# Patient Record
Sex: Female | Born: 1975 | Hispanic: Yes | Marital: Married | Smoking: Never smoker
Health system: Southern US, Community
[De-identification: ages and names within clinical notes are randomized; demographics above are authoritative.]

## PROBLEM LIST (undated history)

## (undated) DIAGNOSIS — F32A Depression, unspecified: Secondary | ICD-10-CM

## (undated) DIAGNOSIS — E7800 Pure hypercholesterolemia, unspecified: Secondary | ICD-10-CM

## (undated) DIAGNOSIS — E559 Vitamin D deficiency, unspecified: Secondary | ICD-10-CM

## (undated) DIAGNOSIS — D573 Sickle-cell trait: Secondary | ICD-10-CM

## (undated) DIAGNOSIS — F329 Major depressive disorder, single episode, unspecified: Secondary | ICD-10-CM

## (undated) DIAGNOSIS — I8390 Asymptomatic varicose veins of unspecified lower extremity: Secondary | ICD-10-CM

## (undated) DIAGNOSIS — R569 Unspecified convulsions: Secondary | ICD-10-CM

## (undated) DIAGNOSIS — J302 Other seasonal allergic rhinitis: Secondary | ICD-10-CM

## (undated) DIAGNOSIS — Z969 Presence of functional implant, unspecified: Secondary | ICD-10-CM

## (undated) HISTORY — DX: Asymptomatic varicose veins of unspecified lower extremity: I83.90

## (undated) HISTORY — DX: Depression, unspecified: F32.A

## (undated) HISTORY — DX: Sickle-cell trait: D57.3

## (undated) HISTORY — DX: Unspecified convulsions: R56.9

## (undated) HISTORY — DX: Vitamin D deficiency, unspecified: E55.9

## (undated) HISTORY — PX: APPENDECTOMY: SHX54

## (undated) HISTORY — DX: Major depressive disorder, single episode, unspecified: F32.9

---

## 1997-10-28 ENCOUNTER — Emergency Department (HOSPITAL_COMMUNITY): Admission: EM | Admit: 1997-10-28 | Discharge: 1997-10-29 | Payer: Self-pay | Admitting: Emergency Medicine

## 1999-01-07 ENCOUNTER — Other Ambulatory Visit: Admission: RE | Admit: 1999-01-07 | Discharge: 1999-01-07 | Payer: Self-pay | Admitting: Gynecology

## 1999-07-18 ENCOUNTER — Inpatient Hospital Stay (HOSPITAL_COMMUNITY): Admission: AD | Admit: 1999-07-18 | Discharge: 1999-07-21 | Payer: Self-pay | Admitting: Gynecology

## 1999-08-25 ENCOUNTER — Other Ambulatory Visit: Admission: RE | Admit: 1999-08-25 | Discharge: 1999-08-25 | Payer: Self-pay | Admitting: Internal Medicine

## 2000-02-06 ENCOUNTER — Ambulatory Visit (HOSPITAL_COMMUNITY): Admission: RE | Admit: 2000-02-06 | Discharge: 2000-02-06 | Payer: Self-pay | Admitting: Gynecology

## 2000-02-06 HISTORY — PX: TUBAL LIGATION: SHX77

## 2000-09-23 ENCOUNTER — Ambulatory Visit (HOSPITAL_COMMUNITY): Admission: RE | Admit: 2000-09-23 | Discharge: 2000-09-23 | Payer: Self-pay | Admitting: Gynecology

## 2000-09-23 ENCOUNTER — Other Ambulatory Visit: Admission: RE | Admit: 2000-09-23 | Discharge: 2000-09-23 | Payer: Self-pay | Admitting: Internal Medicine

## 2001-09-13 ENCOUNTER — Other Ambulatory Visit: Admission: RE | Admit: 2001-09-13 | Discharge: 2001-09-13 | Payer: Self-pay | Admitting: Gynecology

## 2002-10-05 ENCOUNTER — Other Ambulatory Visit: Admission: RE | Admit: 2002-10-05 | Discharge: 2002-10-05 | Payer: Self-pay | Admitting: Gynecology

## 2003-10-12 ENCOUNTER — Other Ambulatory Visit: Admission: RE | Admit: 2003-10-12 | Discharge: 2003-10-12 | Payer: Self-pay | Admitting: Gynecology

## 2004-10-13 ENCOUNTER — Other Ambulatory Visit: Admission: RE | Admit: 2004-10-13 | Discharge: 2004-10-13 | Payer: Self-pay | Admitting: Gynecology

## 2005-10-14 ENCOUNTER — Other Ambulatory Visit: Admission: RE | Admit: 2005-10-14 | Discharge: 2005-10-14 | Payer: Self-pay | Admitting: Gynecology

## 2005-11-12 ENCOUNTER — Ambulatory Visit: Payer: Self-pay | Admitting: Internal Medicine

## 2005-12-27 ENCOUNTER — Ambulatory Visit (HOSPITAL_COMMUNITY): Admission: RE | Admit: 2005-12-27 | Discharge: 2005-12-27 | Payer: Self-pay | Admitting: Gynecology

## 2005-12-28 ENCOUNTER — Encounter (INDEPENDENT_AMBULATORY_CARE_PROVIDER_SITE_OTHER): Payer: Self-pay | Admitting: *Deleted

## 2005-12-28 ENCOUNTER — Inpatient Hospital Stay (HOSPITAL_COMMUNITY): Admission: RE | Admit: 2005-12-28 | Discharge: 2005-12-30 | Payer: Self-pay | Admitting: Gynecology

## 2005-12-28 HISTORY — PX: ABDOMINAL HYSTERECTOMY: SHX81

## 2006-06-18 ENCOUNTER — Ambulatory Visit: Payer: Self-pay | Admitting: Internal Medicine

## 2006-06-18 LAB — CONVERTED CEMR LAB
Calcium: 9.5 mg/dL (ref 8.4–10.5)
Chloride: 106 meq/L (ref 96–112)
Creatinine, Ser: 0.6 mg/dL (ref 0.4–1.2)
Glucose, Bld: 90 mg/dL (ref 70–99)
Potassium: 4 meq/L (ref 3.5–5.1)
Sed Rate: 13 mm/hr (ref 0–25)
Sodium: 139 meq/L (ref 135–145)

## 2006-09-22 ENCOUNTER — Encounter (INDEPENDENT_AMBULATORY_CARE_PROVIDER_SITE_OTHER): Payer: Self-pay | Admitting: Specialist

## 2006-09-22 ENCOUNTER — Ambulatory Visit (HOSPITAL_BASED_OUTPATIENT_CLINIC_OR_DEPARTMENT_OTHER): Admission: RE | Admit: 2006-09-22 | Discharge: 2006-09-22 | Payer: Self-pay | Admitting: Gynecology

## 2006-09-22 HISTORY — PX: LAPAROSCOPIC LYSIS OF ADHESIONS: SHX5905

## 2006-09-22 HISTORY — PX: OVARIAN CYST REMOVAL: SHX89

## 2006-11-15 ENCOUNTER — Ambulatory Visit: Payer: Self-pay | Admitting: Internal Medicine

## 2006-11-18 ENCOUNTER — Encounter: Payer: Self-pay | Admitting: Internal Medicine

## 2006-12-02 ENCOUNTER — Telehealth: Payer: Self-pay | Admitting: Internal Medicine

## 2006-12-16 ENCOUNTER — Encounter: Payer: Self-pay | Admitting: Internal Medicine

## 2007-02-02 ENCOUNTER — Ambulatory Visit: Payer: Self-pay | Admitting: Family Medicine

## 2007-02-03 ENCOUNTER — Encounter (INDEPENDENT_AMBULATORY_CARE_PROVIDER_SITE_OTHER): Payer: Self-pay | Admitting: Family Medicine

## 2007-03-29 ENCOUNTER — Ambulatory Visit: Payer: Self-pay | Admitting: Internal Medicine

## 2007-04-08 ENCOUNTER — Other Ambulatory Visit: Admission: RE | Admit: 2007-04-08 | Discharge: 2007-04-08 | Payer: Self-pay | Admitting: Gynecology

## 2007-04-13 ENCOUNTER — Encounter: Payer: Self-pay | Admitting: Internal Medicine

## 2007-04-13 LAB — CONVERTED CEMR LAB
Cholesterol: 252 mg/dL
Cholesterol: 252 mg/dL
Total CHOL/HDL Ratio: 6.7

## 2007-04-19 ENCOUNTER — Encounter: Payer: Self-pay | Admitting: Internal Medicine

## 2007-05-17 ENCOUNTER — Ambulatory Visit: Payer: Self-pay | Admitting: Internal Medicine

## 2007-05-17 DIAGNOSIS — E785 Hyperlipidemia, unspecified: Secondary | ICD-10-CM

## 2008-04-09 ENCOUNTER — Encounter: Payer: Self-pay | Admitting: Gynecology

## 2008-04-09 ENCOUNTER — Other Ambulatory Visit: Admission: RE | Admit: 2008-04-09 | Discharge: 2008-04-09 | Payer: Self-pay | Admitting: Gynecology

## 2008-04-09 ENCOUNTER — Ambulatory Visit: Payer: Self-pay | Admitting: Gynecology

## 2008-04-16 ENCOUNTER — Ambulatory Visit: Payer: Self-pay | Admitting: Gynecology

## 2008-07-11 ENCOUNTER — Ambulatory Visit: Payer: Self-pay | Admitting: Gynecology

## 2008-07-30 ENCOUNTER — Ambulatory Visit: Payer: Self-pay | Admitting: Gynecology

## 2008-10-25 ENCOUNTER — Ambulatory Visit: Payer: Self-pay | Admitting: Gynecology

## 2009-01-07 ENCOUNTER — Observation Stay (HOSPITAL_COMMUNITY): Admission: EM | Admit: 2009-01-07 | Discharge: 2009-01-07 | Payer: Self-pay | Admitting: Emergency Medicine

## 2009-01-31 ENCOUNTER — Ambulatory Visit: Payer: Self-pay | Admitting: Gynecology

## 2009-02-07 ENCOUNTER — Ambulatory Visit: Payer: Self-pay | Admitting: Gynecology

## 2009-04-04 ENCOUNTER — Ambulatory Visit: Payer: Self-pay | Admitting: Gynecology

## 2009-04-04 ENCOUNTER — Encounter: Payer: Self-pay | Admitting: Gynecology

## 2009-04-04 ENCOUNTER — Other Ambulatory Visit: Admission: RE | Admit: 2009-04-04 | Discharge: 2009-04-04 | Payer: Self-pay | Admitting: Gynecology

## 2009-09-21 ENCOUNTER — Emergency Department (HOSPITAL_COMMUNITY): Admission: EM | Admit: 2009-09-21 | Discharge: 2009-09-21 | Payer: Self-pay | Admitting: Emergency Medicine

## 2009-09-26 ENCOUNTER — Ambulatory Visit: Payer: Self-pay | Admitting: Gynecology

## 2009-10-04 DIAGNOSIS — R51 Headache: Secondary | ICD-10-CM

## 2009-10-04 DIAGNOSIS — G40909 Epilepsy, unspecified, not intractable, without status epilepticus: Secondary | ICD-10-CM | POA: Insufficient documentation

## 2009-10-04 DIAGNOSIS — F29 Unspecified psychosis not due to a substance or known physiological condition: Secondary | ICD-10-CM | POA: Insufficient documentation

## 2009-10-08 DIAGNOSIS — E559 Vitamin D deficiency, unspecified: Secondary | ICD-10-CM

## 2009-10-09 ENCOUNTER — Ambulatory Visit: Payer: Self-pay | Admitting: Cardiology

## 2009-12-05 ENCOUNTER — Ambulatory Visit: Payer: Self-pay | Admitting: Cardiology

## 2009-12-09 LAB — CONVERTED CEMR LAB
Alkaline Phosphatase: 43 units/L (ref 39–117)
Bilirubin, Direct: 0.1 mg/dL (ref 0.0–0.3)
Cholesterol: 92 mg/dL (ref 0–200)
LDL Cholesterol: 34 mg/dL (ref 0–99)
Total CHOL/HDL Ratio: 3
Triglycerides: 116 mg/dL (ref 0.0–149.0)
VLDL: 23.2 mg/dL (ref 0.0–40.0)

## 2010-01-02 ENCOUNTER — Ambulatory Visit: Payer: Self-pay | Admitting: Cardiology

## 2010-02-04 ENCOUNTER — Ambulatory Visit: Payer: Self-pay | Admitting: Cardiology

## 2010-02-11 ENCOUNTER — Telehealth: Payer: Self-pay | Admitting: Cardiology

## 2010-02-11 LAB — CONVERTED CEMR LAB
AST: 35 units/L (ref 0–37)
Alkaline Phosphatase: 45 units/L (ref 39–117)
Cholesterol: 194 mg/dL (ref 0–200)
HDL: 33.8 mg/dL — ABNORMAL LOW (ref >39.00)
Triglycerides: 218 mg/dL — ABNORMAL HIGH (ref 0.0–149.0)

## 2010-02-12 ENCOUNTER — Telehealth (INDEPENDENT_AMBULATORY_CARE_PROVIDER_SITE_OTHER): Payer: Self-pay | Admitting: *Deleted

## 2010-02-24 ENCOUNTER — Ambulatory Visit: Payer: Self-pay | Admitting: Cardiology

## 2010-02-26 LAB — CONVERTED CEMR LAB
ALT: 18 units/L (ref 0–35)
Bilirubin, Direct: 0.1 mg/dL (ref 0.0–0.3)
Cholesterol: 151 mg/dL (ref 0–200)
LDL Cholesterol: 86 mg/dL (ref 0–99)
Total CHOL/HDL Ratio: 4
Triglycerides: 137 mg/dL (ref 0.0–149.0)
VLDL: 27.4 mg/dL (ref 0.0–40.0)

## 2010-03-25 ENCOUNTER — Ambulatory Visit: Payer: Self-pay | Admitting: Cardiology

## 2010-03-25 DIAGNOSIS — E663 Overweight: Secondary | ICD-10-CM | POA: Insufficient documentation

## 2010-04-08 ENCOUNTER — Ambulatory Visit: Payer: Self-pay | Admitting: Gynecology

## 2010-04-08 ENCOUNTER — Other Ambulatory Visit: Admission: RE | Admit: 2010-04-08 | Discharge: 2010-04-08 | Payer: Self-pay | Admitting: Gynecology

## 2010-06-15 ENCOUNTER — Encounter: Payer: Self-pay | Admitting: *Deleted

## 2010-06-24 NOTE — Progress Notes (Signed)
  Faxed Labs over to [facility] in [place] @ [phone] [facility]  February 12, 2010 8:57 AM

## 2010-06-24 NOTE — Assessment & Plan Note (Signed)
Summary: 3 month rov.sl   Visit Type:  3 mo f/u Referring Provider:  Cherylin Mylar Primary Provider:  Valente David  CC:  no cardiac complaints today..pt lost 14 lb since 09/2009.  History of Present Illness: Ms Ancil Linsey returns today for management of her mixed hyperlipidemia and obesity.  She complains of muscle aches in her back in her knees. She is convinced as the Crestor at 40 mg a day. She did not have this at 10 mg a day when I first met her. However, looking at her numbers on initial dilation, I am not sure she was taking her Crestor daily. She states through an interpreter that she has been taking it daily for over a year.  She had a dramatic response to 40 mg a day with her total cholesterol being 92, triglycerides 116, HDL 35, LDL 34, VLDL 23. LFTs were normal.  She has lost 14 pounds.  Current Medications (verified): 1)  Vitamin D (Ergocalciferol) 50000 Unit Caps (Ergocalciferol) .Marland Kitchen.. 1 Tab Weekly 2)  Crestor 20 Mg Tabs (Rosuvastatin Calcium) .Marland Kitchen.. 1 Once Daily 3)  Omega-3 Fish Oil 1000 Mg Caps (Omega-3 Fatty Acids) .... 2 Caps Once Daily 4)  Caltrate 600+d Plus 600-400 Mg-Unit Tabs (Calcium Carbonate-Vit D-Min) .Marland Kitchen.. 1 Tab Two Times A Day 5)  Doxepin Hcl 10 Mg Caps (Doxepin Hcl) .Marland Kitchen.. 1 Tab Once Daily 6)  Gabapentin 600 Mg Tabs (Gabapentin) .Marland Kitchen.. 1 Tab Three Times A Day 7)  Levetiracetam 500 Mg Tabs (Levetiracetam) .Marland Kitchen.. 1 Tab Qam...2 Tab S Qhs  Allergies: 1)  ! Tylenol  Past History:  Past Medical History: Last updated: 10/04/2009 HYPERLIPIDEMIA-MIXED (ICD-272.4) VITAMIN D DEFICIENCY (ICD-268.9) CONFUSION (ICD-298.9) HEADACHE (ICD-784.0) SEIZURES, HX OF (ICD-V12.49)    Past Surgical History: Last updated: 10/09/2009 hysterectomy Ovary cyst removed c-section x 2 Appendectomy  Family History: Last updated: 10/09/2009 no family history of heart disease  Social History: Last updated: 10/09/2009 Full Time Married  Tobacco Use - No.  Alcohol Use - no Regular  Exercise - no Drug Use - no  Risk Factors: Exercise: no (10/09/2009)  Risk Factors: Smoking Status: never (10/09/2009)  She has no other complaints. Review of Systems       negative other than history of present illness  Vital Signs:  Patient profile:   35 year old female Height:      64 inches Weight:      144 pounds Pulse rate:   60 / minute Pulse rhythm:   regular BP sitting:   100 / 62  (left arm) Cuff size:   large  Vitals Entered By: Danielle Rankin, CMA (January 02, 2010 9:28 AM)  Physical Exam  General:  obese.   Head:  normocephalic and atraumatic Eyes:  PERRLA/EOM intact; conjunctiva and lids normal. Neck:  Neck supple, no JVD. No masses, thyromegaly or abnormal cervical nodes. Chest Octavia Velador:  no deformities or breast masses noted Lungs:  Clear bilaterally to auscultation and percussion. Heart:  PMI nondisplaced, regular rate and rhythm, normal S1-S2, carotid upstrokes equal without bruits Msk:  Back normal, normal gait. Muscle strength and tone normal. Pulses:  pulses normal in all 4 extremities Extremities:  No clubbing or cyanosis. Neurologic:  Alert and oriented x 3. Skin:  Intact without lesions or rashes. Psych:  Normal affect.   Impression & Recommendations:  Problem # 1:  OVERWEIGHT/OBESITY (ICD-278.02) Assessment Improved  Problem # 2:  HYPERLIPIDEMIA-MIXED (ICD-272.4) Assessment: Improved Her numbers dramatically improved with weight loss and 40 mg of Crestor. However, as noted above,  she is having muscle aches and pains in her back and knees. She would like to stop the medication and have repeat blood work. I have asked her to stop the Crestor and report if her aches get better. Will repeat blood work on no statin and 4 weeks. Contrary to her opinion, she will probably need a different statin most likely pravastatin. We'll reassess at that time. Patient has been encouraged to continue to lose weight and to exercise her weight. I will see her back in 3  months. The following medications were removed from the medication list:    Crestor 20 Mg Tabs (Rosuvastatin calcium) .Marland Kitchen... 1 once daily  Patient Instructions: 1)  Your physician recommends that you schedule a follow-up appointment in: 3 months with Dr. Daleen Squibb 2)  Your physician has recommended you make the following change in your medication:  stop crestor 3)  Your physician recommends that you return for a FASTING lipid profile and liver in 4 weeks.   272.4, 278.00

## 2010-06-24 NOTE — Assessment & Plan Note (Signed)
Summary: per check out/interper set up with Rashanda/saf   Visit Type:  rov Referring Provider:  Cherylin Mylar Primary Provider:  Valente David  CC:  pt states after she was told to increase her Pravastatin to 40 mg she started getting severe back pain so she cut back to 20 mg....sob at times.....  History of Present Illness: Beth Blackwell comes in today for followup of her mixed hyperlipidemia and obesity.  We increased her pravastatin 40 mg q.h.s. but she had significant backaches. She cut it back to 20 mg. Her numbers on 20 mg on October 3 toe cholesterol 151 triglycerides 137 ratio 37.3 LDL 86 LFTs were normal.  She is not walking on a regular basis. She is not working on weight reduction as well. She is asymptomatic.  Current Medications (verified): 1)  Vitamin D (Ergocalciferol) 50000 Unit Caps (Ergocalciferol) .Marland Kitchen.. 1 Tab Weekly 2)  Omega-3 Fish Oil 1000 Mg Caps (Omega-3 Fatty Acids) .... 2 Caps Once Daily 3)  Caltrate 600+d Plus 600-400 Mg-Unit Tabs (Calcium Carbonate-Vit D-Min) .Marland Kitchen.. 1 Tab Two Times A Day 4)  Doxepin Hcl 10 Mg Caps (Doxepin Hcl) .Marland Kitchen.. 1 Tab Once Daily 5)  Gabapentin 600 Mg Tabs (Gabapentin) .Marland Kitchen.. 1 Tab Three Times A Day 6)  Levetiracetam 500 Mg Tabs (Levetiracetam) .Marland Kitchen.. 1 Tab Qam...2 Tab S Qhs 7)  Pravastatin Sodium 20 Mg Tabs (Pravastatin Sodium) .Marland Kitchen.. 1 Tab At Bedtime  Allergies: 1)  ! Tylenol  Past History:  Past Medical History: Last updated: 10/04/2009 HYPERLIPIDEMIA-MIXED (ICD-272.4) VITAMIN D DEFICIENCY (ICD-268.9) CONFUSION (ICD-298.9) HEADACHE (ICD-784.0) SEIZURES, HX OF (ICD-V12.49)    Past Surgical History: Last updated: 10/09/2009 hysterectomy Ovary cyst removed c-section x 2 Appendectomy  Family History: Last updated: 10/09/2009 no family history of heart disease  Social History: Last updated: 10/09/2009 Full Time Married  Tobacco Use - No.  Alcohol Use - no Regular Exercise - no Drug Use - no  Risk Factors: Exercise: no  (10/09/2009)  Risk Factors: Smoking Status: never (10/09/2009)  Review of Systems       negative other than history of present illness  Vital Signs:  Patient profile:   35 year old female Height:      64 inches Weight:      145.4 pounds BMI:     25.05 Pulse rate:   72 / minute Pulse rhythm:   regular BP sitting:   80 / 60  (left arm) Cuff size:   large  Vitals Entered By: Danielle Rankin, CMA (March 25, 2010 9:04 AM)  Physical Exam  General:  obese.   Head:  normocephalic and atraumatic Eyes:  PERRLA/EOM intact; conjunctiva and lids normal. Neck:  Neck supple, no JVD. No masses, thyromegaly or abnormal cervical nodes. Lungs:  Clear bilaterally to auscultation and percussion. Heart:  PMI not displaced, normal S1-S2, no bruits Msk:  Back normal, normal gait. Muscle strength and tone normal. Pulses:  pulses normal in all 4 extremities Extremities:  No clubbing or cyanosis. Neurologic:  Alert and oriented x 3. Skin:  Intact without lesions or rashes. Psych:  Normal affect.   Problems:  Medical Problems Added: 1)  Dx of Overweight/obesity  (ICD-278.02)  Impression & Recommendations:  Problem # 1:  HYPERLIPIDEMIA-MIXED (ICD-272.4) Continue 20 mg q.h.s. Followup labs in a year. Her updated medication list for this problem includes:    Pravastatin Sodium 20 Mg Tabs (Pravastatin sodium) .Marland Kitchen... 1 tab at bedtime  Problem # 2:  OVERWEIGHT/OBESITY (ICD-278.02) I have encouraged her to lose weight and walk  3 hours per week.  Problem # 3:  HYPERLIPIDEMIA-MIXED (ICD-272.4)  Her updated medication list for this problem includes:    Pravastatin Sodium 20 Mg Tabs (Pravastatin sodium) .Marland Kitchen... 1 tab at bedtime  Patient Instructions: 1)  Your physician recommends that you schedule a follow-up appointment in: 1 YEAR 2)  Your physician recommends that you return for lab work [id number]

## 2010-06-24 NOTE — Progress Notes (Signed)
Summary: test results  Phone Note Call from Patient Call back at Home Phone [phone] Call back at [phone]   Caller: Patient Reason for Call: Lab or Test Results Initial call taken by: Judie Grieve,  February 11, 2010 4:11 PM  Follow-up for Phone Call        Called patient with lipid lab results. She felt better off the Crestor. She will start Pravastatin 20mg  every day in the evening and return for labs on 03/27/2010 per Dr.Wall.  Layne Benton, RN, BSN  February 11, 2010 4:46 PM     New/Updated Medications: PRAVASTATIN SODIUM 20 MG TABS (PRAVASTATIN SODIUM) 1 tab prior to bedtime Prescriptions: PRAVASTATIN SODIUM 20 MG TABS (PRAVASTATIN SODIUM) 1 tab prior to bedtime  #30 x 6   Entered by:   Layne Benton, RN, BSN   Authorized by:   Gaylord Shih, MD, [facility]   Signed by:   Layne Benton, RN, BSN on 02/11/2010   Method used:   [facility] to        [facility] [phone]* (retail)       [address]       [place], [place]  [address]       Ph: [phone] or [phone]       Fax: [phone]   RxID:   [id number]   Appended Document: test results  Reviewed Juanito Doom, MD

## 2010-06-24 NOTE — Assessment & Plan Note (Signed)
Summary: np6/hyperlipidemia/jml   Visit Type:  new pt visit Referring Provider:  Cherylin Mylar Primary Provider:  Valente David  CC:  pt had 2 episodes of what she called seizures and passed out and bit her tongue.....  History of Present Illness: Mrs. Ancil Linsey comes in today referred by Dr. Lily Peer for mixed hyperlipidemia.  She has been on Crestor 10 mg per day and 2 g official per day. Her last profile was total cholesterol 230, HDL 51, cholesterol HDL ratio 4.5, LDL of 113, and triglycerides of 325.  She is overweight and exercises some but not a lot. She likes pasta and fries.  She denies any symptoms of chest pain, angina, shortness of breath, orthopnea, edema.  She does not abuse alcohol.  Preventive Screening-Counseling & Management  Alcohol-Tobacco     Smoking Status: never  Caffeine-Diet-Exercise     Does Patient Exercise: no      Drug Use:  no.    Current Medications (verified): 1)  Vitamin D (Ergocalciferol) 50000 Unit Caps (Ergocalciferol) .Marland Kitchen.. 1 Tab Weekly 2)  Crestor 10 Mg Tabs (Rosuvastatin Calcium) .Marland Kitchen.. 1 Tab At Bedtime 3)  Omega-3 Fish Oil 1000 Mg Caps (Omega-3 Fatty Acids) .... 2 Caps Once Daily 4)  Caltrate 600+d Plus 600-400 Mg-Unit Tabs (Calcium Carbonate-Vit D-Min) .Marland Kitchen.. 1 Tab Two Times A Day 5)  Doxepin Hcl 10 Mg Caps (Doxepin Hcl) .Marland Kitchen.. 1 Tab Once Daily 6)  Gabapentin 600 Mg Tabs (Gabapentin) .Marland Kitchen.. 1 Tab Three Times A Day  Allergies (verified): 1)  ! Tylenol  Past History:  Past Medical History: Last updated: 10/04/2009 HYPERLIPIDEMIA-MIXED (ICD-272.4) VITAMIN D DEFICIENCY (ICD-268.9) CONFUSION (ICD-298.9) HEADACHE (ICD-784.0) SEIZURES, HX OF (ICD-V12.49)    Family History: Last updated: 10/09/2009 no family history of heart disease  Social History: Last updated: 10/09/2009 Full Time Married  Tobacco Use - No.  Alcohol Use - no Regular Exercise - no Drug Use - no  Risk Factors: Exercise: no (10/09/2009)  Risk Factors: Smoking  Status: never (10/09/2009)  Past Surgical History: hysterectomy Ovary cyst removed c-section x 2 Appendectomy  Family History: no family history of heart disease  Social History: Full Time Married  Tobacco Use - No.  Alcohol Use - no Regular Exercise - no Drug Use - no Smoking Status:  never Does Patient Exercise:  no Drug Use:  no  Review of Systems       negative other than history of present illness  Vital Signs:  Patient profile:   35 year old female Height:      64 inches Weight:      158 pounds BMI:     27.22 Pulse rate:   65 / minute Pulse rhythm:   regular BP sitting:   112 / 60  (left arm) Cuff size:   large  Vitals Entered By: Danielle Rankin, CMA (Oct 09, 2009 3:52 PM)  Physical Exam  General:  obese.   Head:  normocephalic and atraumatic Eyes:  PERRLA/EOM intact; conjunctiva and lids normal. Neck:  Neck supple, no JVD. No masses, thyromegaly or abnormal cervical nodes. Chest Daryel Kenneth:  no deformities or breast masses noted Lungs:  Clear bilaterally to auscultation and percussion. Heart:  Non-displaced PMI, chest non-tender; regular rate and rhythm, S1, S2 without murmurs, rubs or gallops. Carotid upstroke normal, no bruit. Normal abdominal aortic size, no bruits. Femorals normal pulses, no bruits. Pedals normal pulses. No edema, no varicosities. Abdomen:  Bowel sounds positive; abdomen soft and non-tender without masses, organomegaly, or hernias noted. No hepatosplenomegaly. Msk:  Back normal, normal gait. Muscle strength and tone normal. Pulses:  pulses normal in all 4 extremities Extremities:  No clubbing or cyanosis. Neurologic:  Alert and oriented x 3. Skin:  Intact without lesions or rashes. Psych:  Normal affect.   Problems:  Medical Problems Added: 1)  Dx of Overweight/obesity  (ICD-278.02)  EKG  Procedure date:  10/09/2009  Findings:      normal sinus rhythm, no acute changes.  Impression & Recommendations:  Problem # 1:   HYPERLIPIDEMIA-MIXED (ICD-272.4) Assessment Deteriorated  Her updated medication list for this problem includes:    Crestor 40 Mg Tabs (Rosuvastatin calcium) .Marland Kitchen... 1 once daily  Orders: EKG w/ Interpretation (93000)  Problem # 2:  OVERWEIGHT/OBESITY (ICD-278.02) Assessment: Unchanged  Patient Instructions: 1)  Your physician recommends that you schedule a follow-up appointment in: 3 MONTHS WITH DR Bethanne Mule 2)  Your physician recommends that you return for lab work in:8 WEEKS FASTING LIPID LIVER 272.4 V58.69 3)  Your physician has recommended you make the following change in your medication: INCREASE CREASTOR TO 40 MG EVERY DAY 4)  Your physician recommends a low cholesterol, low fat diet. Please see [facility] handout. 5)  Your physician discussed the importance of regular exercise and recommended that you start or continue a regular exercise program for good health. EXERCISE 3HOURS A WEEK 6)  Your physician encouraged you to lose weight for better health. 7)  LOW CARBOHYDRATE DIET  Prescriptions: CRESTOR 40 MG TABS (ROSUVASTATIN CALCIUM) 1 once daily  #30 x 11   Entered by:   Scherrie Bateman, LPN   Authorized by:   Gaylord Shih, MD, [facility]   Signed by:   Scherrie Bateman, LPN on 54/01/8118   Method used:   [facility] to        [facility] [phone]* (retail)       [address]       [place], [place]  [address]       Ph: [phone] or [phone]       Fax: [phone]   RxID:   [id number] CRESTOR 40 MG TABS (ROSUVASTATIN CALCIUM) 1 once daily  #30 x 11   Entered by:   Scherrie Bateman, LPN   Authorized by:   Gaylord Shih, MD, [facility]   Signed by:   Scherrie Bateman, LPN on 87/56/4332   Method used:   [facility]axed to ...       [facility] (retail)       [address]       [place], [place]  [address]       Ph: [phone]       Fax: [phone]   RxID:   [id number]

## 2010-06-25 ENCOUNTER — Ambulatory Visit (HOSPITAL_BASED_OUTPATIENT_CLINIC_OR_DEPARTMENT_OTHER)
Admission: RE | Admit: 2010-06-25 | Discharge: 2010-06-26 | Disposition: A | Discharge status: Home / Self Care | Payer: 59 | Attending: Orthopedic Surgery | Admitting: Orthopedic Surgery

## 2010-06-25 DIAGNOSIS — Z01812 Encounter for preprocedural laboratory examination: Secondary | ICD-10-CM | POA: Insufficient documentation

## 2010-06-25 DIAGNOSIS — E785 Hyperlipidemia, unspecified: Secondary | ICD-10-CM | POA: Insufficient documentation

## 2010-06-25 DIAGNOSIS — M24139 Other articular cartilage disorders, unspecified wrist: Secondary | ICD-10-CM | POA: Insufficient documentation

## 2010-06-25 HISTORY — PX: WRIST ARTHROSCOPY WITH ULNA SHORTENING: SHX5681

## 2010-06-25 LAB — POCT HEMOGLOBIN-HEMACUE: Hemoglobin: 11.8 g/dL — ABNORMAL LOW (ref 12.0–15.0)

## 2010-07-14 ENCOUNTER — Ambulatory Visit: Payer: 59 | Admitting: Gynecology

## 2010-07-14 NOTE — Op Note (Signed)
NAMESOMALY, MARTENEY NO.:  [id number]  MEDICAL RECORD NO.:  [id number]          PATIENT TYPE:  AMB  LOCATION:  [facility]                          FACILITY:  [facility]  PHYSICIAN:  Cindee Salt, M.D.       DATE OF BIRTH:  1975/10/29  DATE OF PROCEDURE:  06/25/2010 DATE OF DISCHARGE:                              OPERATIVE REPORT   PREOPERATIVE DIAGNOSIS:  Ulnocarpal abutment, pisotriquetral arthrosis, right wrist.  POSTOPERATIVE DIAGNOSES: 1. Ulnocarpal abutment, pisotriquetral arthrosis, right wrist. 2. Scapholunate ligament tear, triangular fibrocartilage complex tear.  OPERATION:  Arthroscopy of right wrist with debridement of triangular fibrocartilage complex, shrinkage of scapholunate ligament, opened excision pisiform with ulnar shortening osteotomy, right wrist.  SURGEON:  Cindee Salt, MD  ANESTHESIA:  Axillary general.  ANESTHESIOLOGIST:  Zenon Mayo, MD  HISTORY:  The patient is a 35 year old female with a history of injury to her right wrist with wrist pain, a TFCC tear was noted on MRI, this does not responded to conservative treatment.  She ultimately suffered a fracture of her fifth metacarpal, this has been treated, this gone on to heal.  She is admitted now for arthroscopy of her wrist.  She also has pain in the pisotriquetral area with a positive pisotriquetral grind, this also has not responded to conservative treatment.  She is well aware of risks and complications including infection, recurrence injury to arteries, nerves, tendons, incomplete relief of symptoms and dystrophy.  In the preoperative area, the patient was seen, the extremity marked by both the patient and surgeon, and antibiotic given.  PROCEDURE:  The patient was brought to the operating room where a supraclavicular block was carried out without difficulty.  General anesthetic was also given.  Time-out taken confirming the patient and procedure.  TEDs stocking was placed  for control DVT.  She was prepped using ChloraPrep, supine position, right arm free.  A 3-minute dry time was allowed.  The limb was placed in the arthroscopy tower, 10 pounds of traction applied.  The joint inflated to 3/4 portal.  Transverse incision was made, deepened with hemostat.  Blunt trocar was used to enter the joint.  Joint was inspected a partial tear proximally, the scapholunate ligament with a significant widening, hang down of the scapholunate ligament with significant redundancy was noted.  Volar radial wrist ligaments were intact.  Cartilage was intact.  TFCC showed a delaminating tear in the central aspect.  The lunotriquetral joint appeared to be intact, 4/5 portal was opened, the irrigation catheter placed in 6U.  The lunotriquetral joint was inspected, found to be intact.  The midcarpal joint was then inspected through the ulnar midcarpal portal after inflation.  Transverse incision was made, deepened with a hemostat.  Blunt trocar was used to enter the joint.  No significant widening of the scapholunate and lunotriquetral ligaments were noted.  Cartilage was intact.  Type 2 lunate was present.  The scope was reintroduced proximally and debridement of the TFCC was then performed with sharp dissection followed by an Merton Border wand full radius shaver.  The scope placed in the 4/5 portal.  The scapholunate  was then shrunk firmly tightening this.  A further debridement, synovectomy was then performed.  The fovea was probed with the probe, appeared to be intact.  The instruments were removed.  The portals were closed with interrupted 5-0 Vicryl Rapide sutures.  The limb was exsanguinated with an Esmarch bandage.  Tourniquet was placed high and the arm was inflated to 250 mmHg.  A volar Brunner type incision was made over the pisiform, carried down through the subcutaneous tissue.  Bleeders were again electrocauterized with bipolar.  The flexor carpi ulnaris was identified and  incision was made longitudinally.  The pisiform was then removed in toto using blunt sharp dissection with great care taken to protect the awl ulnar artery and nerve.  The area was irrigated.  The tendon was then repaired with figure-of-eight 4-0 Mersilene sutures.  The skin with interrupted 5-0 Vicryl Rapide after closure of the subcutaneous tissue with interrupted 4-0 Vicryl.  A separate incision was then made over the ulnar aspect of the forearm, carried down through the subcutaneous tissue.  Bleeders were again electrocauterized with bipolar.  The dorsal sensory branch of the ulnar nerve was not seen, it was looked forward. An incision was then made before the fifth, sixth dorsal compartment carried down to the ulna.  Bleeders were again electrocauterized with bipolar and tied with 4-0 Vicryl.  Periosteum was incised.  The seven- hole 2.4 Modular plate was then selected, this was bent to the contour of the ulna.  Retractor was placed.  The distal three screws were then placed, nonlocking, each measured 10 mm.  An osteotomy was then made, obliquely in nature with an oscillating saw removing approximately 3 mm of bone.  The plate was reapplied distally, clamped proximally.  This was then compressed and an oblique screw placed across this, measured 14 mm.  This firmly compressed the osteotomy site.  The remaining three proximal screws were then placed, each measured 12 mm.  X-rays confirmed adequate shortening of the ulna.  The osteotomy site was not clearly visible with the 14-mm screw crossing, which appeared long.  This was replaced with a 12-mm screw.  Repeat x-rays reveal the screws lied in good position.  The osteotomy site was fully closed.  The wound was copiously irrigated with saline.  The periosteum was closed with a running 4-0 Vicryl suture.  The fascia was closed with a running 4-0 Vicryl, the subcutaneous tissue with interrupted 4-0 Vicryl, and the skin with interrupted 4-0  Vicryl Rapide sutures.  A sterile compressive dressing, long-arm thumb spica splint was applied.  On deflation of the tourniquet, all fingers were immediately pinked.  She was taken to the recovery room for observation in satisfactory condition.  She will be admitted for overnight stay for pain control, discharged on Percocet, to return in 1 week.          ______________________________ Cindee Salt, M.D.     GK/MEDQ  D:  06/25/2010  T:  06/26/2010  Job:  [id number]  Electronically Signed by Cindee Salt M.D. on 07/14/2010 02:24:46 PM

## 2010-07-15 ENCOUNTER — Other Ambulatory Visit: Payer: Self-pay | Admitting: Gynecology

## 2010-07-15 ENCOUNTER — Ambulatory Visit (INDEPENDENT_AMBULATORY_CARE_PROVIDER_SITE_OTHER): Payer: 59 | Admitting: Gynecology

## 2010-07-15 DIAGNOSIS — N898 Other specified noninflammatory disorders of vagina: Secondary | ICD-10-CM

## 2010-07-15 DIAGNOSIS — R350 Frequency of micturition: Secondary | ICD-10-CM

## 2010-07-15 DIAGNOSIS — B373 Candidiasis of vulva and vagina: Secondary | ICD-10-CM

## 2010-07-15 DIAGNOSIS — N63 Unspecified lump in unspecified breast: Secondary | ICD-10-CM

## 2010-07-21 ENCOUNTER — Ambulatory Visit
Admission: RE | Admit: 2010-07-21 | Discharge: 2010-07-21 | Disposition: A | Discharge status: Home / Self Care | Payer: 59 | Source: Ambulatory Visit | Attending: Gynecology | Admitting: Gynecology

## 2010-07-21 ENCOUNTER — Other Ambulatory Visit: Payer: Self-pay | Admitting: Gynecology

## 2010-08-12 LAB — RAPID URINE DRUG SCREEN, HOSP PERFORMED
Amphetamines: NOT DETECTED
Benzodiazepines: NOT DETECTED
Opiates: NOT DETECTED
Tetrahydrocannabinol: NOT DETECTED

## 2010-08-12 LAB — URINALYSIS, ROUTINE W REFLEX MICROSCOPIC
Specific Gravity, Urine: 1.014 (ref 1.005–1.030)
Urobilinogen, UA: 0.2 mg/dL (ref 0.0–1.0)

## 2010-08-12 LAB — BASIC METABOLIC PANEL
CO2: 24 mEq/L (ref 19–32)
Chloride: 109 mEq/L (ref 96–112)
Creatinine, Ser: 0.67 mg/dL (ref 0.4–1.2)
GFR calc Af Amer: >60 mL/min (ref >60)
Glucose, Bld: 107 mg/dL — ABNORMAL HIGH (ref 70–99)
Potassium: 3.6 mEq/L (ref 3.5–5.1)

## 2010-08-12 LAB — DIFFERENTIAL
Eosinophils Absolute: 0 10*3/uL (ref 0.0–0.7)
Eosinophils Relative: 1 % (ref 0–5)
Lymphocytes Relative: 32 % (ref 12–46)
Lymphs Abs: 2.3 10*3/uL (ref 0.7–4.0)
Monocytes Absolute: 0.2 10*3/uL (ref 0.1–1.0)
Neutrophils Relative %: 64 % (ref 43–77)

## 2010-08-12 LAB — URINE MICROSCOPIC-ADD ON

## 2010-08-12 LAB — POCT PREGNANCY, URINE: Preg Test, Ur: NEGATIVE

## 2010-08-12 LAB — CBC
RBC: 3.91 MIL/uL (ref 3.87–5.11)
RDW: 12.9 % (ref 11.5–15.5)

## 2010-08-31 LAB — POCT I-STAT, CHEM 8
BUN: 16 mg/dL (ref 6–23)
Calcium, Ion: 1.22 mmol/L (ref 1.12–1.32)
Chloride: 105 meq/L (ref 96–112)
Creatinine, Ser: 0.7 mg/dL (ref 0.4–1.2)
Glucose, Bld: 102 mg/dL — ABNORMAL HIGH (ref 70–99)
HCT: 39 % (ref 36.0–46.0)
Hemoglobin: 13.3 g/dL (ref 12.0–15.0)
Potassium: 3.5 meq/L (ref 3.5–5.1)
Sodium: 140 mEq/L (ref 135–145)
TCO2: 26 mmol/L (ref 0–100)

## 2010-10-10 NOTE — Discharge Summary (Signed)
[facility]  Patient:    Beth Blackwell, Beth Blackwell                          MRN: [id number] Adm. Date:  [id number] Disc. Date: [id number] Attending:  Tonye Royalty Dictator:   Antony Contras, R.N.C., [facility]                           Discharge Summary  DISCHARGE DIAGNOSES:          1. Intrauterine pregnancy at 39.5 weeks with breech                                  presentation.                               2. Low cervical transverse cesarean section, with                                  delivery of a viable female infant.  HISTORY OF PRESENT ILLNESS:   Patient is a 35 year old gravida 2 para 1 with an EDC of July 20, 1999.  Pregnancy has been complicated by a history of pyelonephritis in the previous pregnancy, for which she was hospitalized and required IV antibiotic therapy.  During this pregnancy, she was placed on Macrobid prophylaxis and did well.  She is also sickle cell trait positive, confirmed by  hemoglobin electrophoresis.  Her latest hemoglobin electrophoresis was normal prior to admission.  LABORATORY DATA:              Blood type is O positive, Rh antibody negative, positive sickle cell, rubella positive, negative HBsAG, negative HIV, MSAFP within normal limits, negative group B strep.  HOSPITAL COURSE:              Patient was admitted for a cesarean section due to breech presentation.  She was not interested in attempting version.  She did have a previous cesarean section with her last pregnancy.  Low cervical transverse cesarean section was performed by Dr. Lily Peer under epidural anesthesia. Patient was delivered of an Apgar 8 and 37 female infant weighing 8 pounds 6 ounces. Estimated blood loss was 600 cc.  Postoperative course was benign.  She remained afebrile, had no difficulty voiding.  Postoperative laboratory data revealed hematocrit 33.4, hemoglobin 11.9, platelets 161, WBC 7.9.  She was  discharged on postoperative day #3 in satisfactory condition.  DISPOSITION AND FOLLOW-UP:    With [facility] in four to six weeks for her postpartum exam.  MEDICATIONS:                  1. Prenatal vitamins.                               2. Tylox 20 1-2 p.o. q.4-6h. p.r.n. pain. DD:  08/08/99 TD:  08/08/99 Job: [id number]

## 2010-10-10 NOTE — Op Note (Signed)
[facility]  Patient:    Beth Blackwell, Beth Blackwell                          MRN: [id number] Proc. Date: 02/06/00 Attending:  Gaetano Hawthorne. Lily Peer, M.D.                           Operative Report  PREOPERATIVE DIAGNOSIS:       Request for elective permanent sterilization.  POSTOPERATIVE DIAGNOSIS:      Request for elective permanent sterilization.  OPERATION:                    Laparoscopic tubal sterilization procedure,                               bilateral Hulka clip technique.  SURGEON:                      Juan H. Lily Peer, M.D.  ANESTHESIA:                   General endotracheal.  INDICATIONS:                  A 35 year old gravida 2, para 2, with request for elective permanent sterilization.  FINDINGS:                     Normal pelvic anatomy and normal smooth-appearing liver surface and gallbladder, and absence of appendix from previous appendectomy.  DESCRIPTION OF PROCEDURE:     After the patient was adequately counseled, she was taken to the operating room.  She underwent a successful general endotracheal anesthesia.  She was placed in the low lithotomy position.  The abdomen, vagina, and perineum were prepped and draped in the usual sterile fashion.  The bladder was evacuated of its contents with a red rubber Roxan Hockey, approximately 50 cc.  The patient did receive 1 g of Cefotan for preoperative prophylaxis.                                After the drapes were in placed, a small stab incision was made in the region of the subumbilical region.  The incision was carried down through the skin and subcutaneous tissue to the rectus fascia, at which point, the Veress needle was introduced into the abdominal cavity.  The opening intra-abdominal pressure was 2.5 mm of pressure, and approximately 3.5 L of carbon dioxide were insufflated into the abdominal cavity.                                After this, the Veress was removed.  A 10 mm trocar was  inserted and the sleeve was left in place and the trocar removed. Under laparoscopic guidance, a second puncture site was made 2 cm above the symphysis pubis in the midline under laparoscopic visualization.  After systemic inspection of the upper abdomen as well as the pelvic cavity with the above mentioned normal findings, the right fallopian tube was placed under tension with self-retaining grasper laparoscopically.  A Hulka clip was placed in the proximal one-third portion of the fallopian tube, ascertaining complete occlusion of that tube.  A similar procedure was carried out on  the contralateral side.  Pictures were obtained.  A copy to be kept in the patients record in the hospital, as well as a second set in the records at [facility].                                After the pictures were obtained, the carbon dioxide was removed from the abdominal cavity.  The instruments were removed. The 10 mm trocar site fascia was reapproximated with a figure-of-eight suture of 0 Vicryl suture, and the skin was reapproximated with subcuticular stitch of 4-0 plain catgut suture.  At the 5 mm trocar site only the skin was reapproximated with interrupted sutures of 4-0 plain catgut suture.                                For postoperative analgesia, the patient received 10 cc of 0.25% Marcaine which was infiltrated subcutaneously at both incision sites.  The incisions were covered with band-aids.  The Hulka tenaculum was removed.  Inspection of the cervix demonstrated a small area that was bleeding which was contained with silver nitrate stick application and pressure.                                The patient was awakened and transferred to the recovery room with stable vital signs.  Blood loss was minimal.  Fluid resuscitation consisted of 1200 cc of lactated Ringers. DD:  02/06/00 TD:  02/09/00 Job: [id number]

## 2010-10-10 NOTE — Op Note (Signed)
Beth Blackwell, BIEL NO.:  [id number]   MEDICAL RECORD NO.:  [id number]          PATIENT TYPE:  INP   LOCATION:  9302                          FACILITY:  [facility]   PHYSICIAN:  Juan H. Lily Peer, M.D.DATE OF BIRTH:  [birth year]   DATE OF PROCEDURE:  12/28/2005  DATE OF DISCHARGE:                                 OPERATIVE REPORT   INDICATIONS FOR OPERATION:  A 35 year old gravida 2, para 2 with  dysmenorrhea and menorrhagia.  Patient with history of septate uterus.  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   PREOPERATIVE DIAGNOSES:  1. Menometrorrhagia.  2. Septated uterus.  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 Menometrorrhagia.  2. Septated uterus.  3. 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   SURGEON:  Juan A.  Lily Peer, MD   FIRST ASSISTANT:  Daniel __________, MD   FINDINGS:  Slight indentation in the fundus of the uterus, otherwise normal  ovaries, evidence of previous tubal sterilization.  Hulka clips were evident  in the proximal one third portion of both fallopian tubes.  The rest of the  pelvic cavity was otherwise unremarkable.   DESCRIPTION OF OPERATION:  After the patient was adequately counseled she  was taken to the operating room where she underwent successful general  endotracheal anesthesia.  She had received 2 grams of cefoxitin  prophylactically and also had PSA stockings for DVT prophylaxis.   After successful general endotracheal anesthesia the abdomen, vagina, and  perineum were prepped and draped in the usual sterile fashion.  A Foley  catheter had been inserted and obtained a moderate urinary output.  After  the abdomen was prepped and draped in the usual sterile fashion, a  Pfannenstiel skin incision was made  adjacent to the previous Pfannenstiel  scar.  The incision was carried down through the skin, subcutaneous tissue  down to the rectus fascia whereby a midline nick was made.  The fascia was  incised in a transverse fashion.  The midline raphe was entered.  The  peritoneal cavity was entered cautiously.  The patient was placed in slight  Trendelenburg position after O'Connor-O'Sullivan retractors were in place.  Findings as described above the proximal one.   The proximal portion of the uterus was grasped with Kelly clamps on both  sides for traction.  The right round ligament was identified and transected;  and the anterior broad ligament was incised to the level of the internal  cervical os.  The posterior broad ligament was penetrated with a surgeon's  finger; and the Heaney clamp grasped hugging the uterus.  The Ellik clamp  was placed close distance to the previous clamp; and the right ovarian utero-  ovarian ligament and fallopian tube were transected.  This was suture  ligated with #0 Vicryl suture followed by transfixion stitch of a similar  suture material.  Of note, both Hulka  clips had been removed; and then  passed off the operative field.   A similar procedure was carried out on the contralateral side.  The  remaining broad and cardinal ligaments were serially clamped, cut and suture  ligated with #0 Vicryl suture to the level of the internal cervical os.  The  anterior cervix was incised with a scalpel and entered and with the use of  Jergeson scissors the cervix was removed from the vagina and the cervix and  uterus were passed off the operative field.   The vaginal cuff the angles were secured with figure-of-eight; and the  remaining cuff was closed with interrupted figure-of-eight sutures of #0  Vicryl suture.  The pelvic cavity was copiously irrigated with normal saline  solution for additional hemostasis.  Surgicel was placed in the cuff and  __________ bladder  surface.  Sponge count and needle count were correct.  The O'Connor-O'Sullivan retractor was removed.  The distal peritoneum was  now reapproximated; and the rectus fascia was closed with a running stitch  of #0 Vicryl suture.  Subcutaneous bleeders were Bovie cauterized.  The skin  was reapproximated with skin clips, followed by placement of Xeroform gauze  and 4 by dressing.  The patient was extubated, and transferred to recovery  room with stable vital signs.  Blood loss was 300 mL, IV fluid 1900 mL, and  lactated Ringer's; and urine output was 400 mL.      Juan H. Lily Peer, M.D.  Electronically Signed     JHF/MEDQ  D:  12/28/2005  T:  12/28/2005  Job:  [id number]

## 2010-10-10 NOTE — H&P (Signed)
Beth Blackwell, DIFFEE NO.:  [id number]   MEDICAL RECORD NO.:  [id number]          PATIENT TYPE:  INP   LOCATION:                                FACILITY:  [facility]   PHYSICIAN:  Juan H. Lily Peer, M.D.DATE OF BIRTH:  12/28/2005   DATE OF ADMISSION:  01/01/2006  DATE OF DISCHARGE:  12/30/2005                                HISTORY & PHYSICAL   CHIEF COMPLAINT:  1. Menometrorrhagia.  2. Septated uterus.   HISTORY OF PRESENT ILLNESS:  The patient is a 35 year old gravida 2, para 2  who has been complaining for several months of worsening dysmenorrhea.  The  patient had a sonohistogram and endometrial biopsy for assessment of  endometrial cavity as a result of her dysfunctional uterine bleeding.  She  had also complained of increased weight.  At the time of her annual exam a  few weeks prior, her hemoglobin A1C which was done was normal.  Her Pap  smear was normal.  TSH was normal.  Her cholesterol was slightly elevated at  221.  She was scheduled to return back for a fasting lipid profile.  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  On  sonohistogram, a septum was noted up to the __________ of the lower uterine  segment.  Review of the patient's records indicated that her first cesarean  section in [place] did not describe any evidence of any septum, and the  cesarean section which was done here in the [place] in 2001 just  demonstrated a small arcuate at the fundus of the uterus.  The patient also  had a laparoscopic tubal sterilization procedure in 2001, and there was no  serosal defect or any evidence of separation of both uteruses or externally.  The patient initially was going to be scheduled for endometrial ablation,  but with this abnormal finding, it was decided that it would be safer to  proceed with a hysterectomy.  Due to the fact that she has had multiple  abdominal operations, two cesarean sections and one laparoscopic tubal  ligation, the abdominal approach will be utilized.   ALLERGIES:  THE PATIENT DENIES ANY ALLERGIES.   PAST MEDICAL HISTORY:  1. Two previous cesarean sections, one in [place].  Has a midline scar and      a transverse incision in the [place].  2. Laparoscopic tubal ligation.  3. Appendectomy in the past.   MEDICATIONS:  She takes no medications at the present time.   FAMILY HISTORY:  Diabetes in both parents and hypertension in both parents  as well.   PHYSICAL EXAMINATION:  VITAL SIGNS:  Weight 152 pounds, 5 feet 2.5 inches  tall.  HEENT:  Unremarkable.  NECK:  Supple.  Trachea midline.  No carotid bruits or thyromegaly.  LUNGS:  Clear to auscultation.  Without rhonchi or wheezes.  HEART:  Regular rate and rhythm.  No murmurs or gallops.  BREASTS:  At the time of her annual exam in May of this year, examination  was normal.  ABDOMEN:  Soft, nontender.  Without rebound or guarding.  Midline incision  scar noted as well as a Pfannenstiel scar and subumbilical scar as well.  PELVIC:  Bartholin, urethral, and Skene's glands within normal limits.  Vagina and cervix with no lesions or discharge.  Uterus upper limits of  normal.  Adnexa with no palpable masses or tenderness.  RECTAL:  Deferred.   ASSESSMENT:  35 year old gravida 2, para 2 with menometrorrhagia.  Workup  has consisted of endometrial biopsy, although only one cavity was entered  due to the fact that a septated uterus did not allow for separation of both  sampling, but the only sampling that was obtained demonstrated a benign  endometrium.  Prior Pap smear, TSH, and prolactin were all normal.  The  patient is scheduled to undergo abdominal hysterectomy.  The risks,  benefits, and alternatives of the procedure were discussed.  All questions  were answered, and we will follow accordingly.   PLAN:  The patient is scheduled for total abdominal  hysterectomy on Monday,  December 28, 2005 at 7:30 a.m.  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      Juan H. Lily Peer, M.D.  Electronically Signed     JHF/MEDQ  D:  12/27/2005  T:  12/27/2005  Job:  [id number]

## 2010-10-10 NOTE — Op Note (Signed)
NAMEBRAYLON, LEMMONS NO.:  [id number]   MEDICAL RECORD NO.:  [id number]          PATIENT TYPE:  AMB   LOCATION:  [facility]                         FACILITY:  [facility]   PHYSICIAN:  Juan H. Lily Peer, M.D.DATE OF BIRTH:  May 14, 1976   DATE OF PROCEDURE:  09/22/2006  DATE OF DISCHARGE:                               OPERATIVE REPORT   SURGEON:  Juan H. Lily Peer, M.D.   FIRST ASSISTANT:  Timothy P. Fontaine, M.D.   INDICATIONS FOR OPERATION:  35 year old gravida 2, para 2, with right  ovarian cyst measuring 8 by 5.8 by 6.5 cm and also persistent vaginal  bleeding on and off since her hysterectomy, vaginal cuff granulation  tissue.   PREOPERATIVE DIAGNOSIS:  1. Right ovarian cyst.  2. Vaginal cuff lesion.   POSTOPERATIVE DIAGNOSIS:  1. Right pelvic ovarian cyst.  2. Vaginal cuff lesion/granulation tissue.  3. Extensive abdominal pelvic adhesions.   PROCEDURES PERFORMED:  1. Laparoscopic abdominal pelvic adhesiolysis, extensive.  2. Right ovarian cystectomy.   DESCRIPTION OF OPERATION:  After the patient was adequately counseled,  she was taken to the operating room where she underwent a successful  general endotracheal anesthesia.  She was placed in the low lithotomy  position.  The abdomen, vagina and perineum were prepped and draped in  the usual sterile fashion.  A Foley catheter had been inserted to  monitor urinary output.  The patient received a gram of cefoxitin and  had PSA stockings for DVT prophylaxis.  A small stab incision was made  under the umbilicus followed by introducing the OptiVu trocar under  direct visualization getting it safely into the peritoneal cavity.  Once  the pneumoperitoneum was established, two 5 mm ports were made four  fingerbreadths from the midline under laparoscopic guidance.  The  patient had extensive omentum adhered to the anterior abdominal wall and  pelvic sidewalls, as well, which required meticulous dissection with  counter traction and utilization of the tripolar cauterization unit to  be able to gain access to the pelvic cavity.   Once this was accomplished, the left ovary was normal configuration. The  right ovary appeared to be enlarged with no excrescences seen on the  surface. The right infundibulopelvic ligament was identified.  The ovary  was adherent to the sigmoid colon. A linear incision was made  contralateral to the sigmoid colon where the ovarian capsule was and  with the endoshears, the cyst was separated from the cortex.  It  collapsed and the area was irrigated and the ovarian cyst walls were  meticulously piecemeal away from the cortex. There was good hemostasis.  Interceed was placed for additional hemostasis after the pelvic cavity  had been copiously irrigated with normal saline solution and after  pelvic washings had been obtained.   After ascertaining hemostasis, the pneumoperitoneum was removed.  The 10  mm trocar port site at the umbilicus was closed with a single  interrupted figure-of-eight of 0 Vicryl suture and the subcutaneous  tissue was reapproximated with 3-0 plain catgut suture and the skin  edges of the 10 mm trocar port  and 5 mm skin were reapproximated with  Dermabond.  0.25% Marcaine was infiltrated in all three port sites for  postoperative analgesia.   Attention was then placed to the vaginal aspect of the operation with  the patient placed in the high lithotomy position and vaginal sidewall  retractors for exposure, with the use of Allis clamp bringing the  vaginal cuff into view.  Beneath one of the vaginal folds, there was  granulation tissue which was friable which was excised and using the  [facility] surgical generator with a ball electrode set at 30  watts, the area was then cauterized after the lesions were removed.  This was submitted for pathological evaluation.  The patient was  extubated and transferred to the recovery room with stable  vital signs.  Blood loss was minimal. Fluid resuscitation consisted 1600 mL of  lactated Ringer's.  Urine output 150 mL and clear.      Juan H. Lily Peer, M.D.  Electronically Signed     JHF/MEDQ  D:  09/22/2006  T:  09/22/2006  Job:  [id number]

## 2010-10-10 NOTE — H&P (Signed)
[facility]  Patient:    Beth Blackwell, Beth Blackwell                          MRN: [id number] Adm. Date:  02/06/00 Attending:  Gaetano Hawthorne. Lily Peer, M.D.                         History and Physical  CHIEF COMPLAINT:              Request for elective permanent sterilization.  HISTORY:                      The patient is a 35 year old gravida 2, para 2, who was seen in the office on June 25 for a preoperative consultation.  She was interested in proceeding with a laparosopic tubal sterilization procedure. She has had two cesarean sections in the past.  She suffered from postpartum anemia, resulted in returning back to normal values after being placed on iron supplementation.  She at one time had been interested in a _________ T-380 IUD, but decided between her and her husband to have permanent sterilization. She has been on Micronor 20 _________ contraceptive pills otherwise.  She had been provided with literature information in the office for laparoscopic tubal sterilization procedure.  PAST MEDICAL HISTORY:         The patient had menarche at age 55, regular cycles reported.  She had been on Micronor oral contraceptive pills.  Cesarean sections in 1996 and 2001 respectively.  The first one was in [place], the second one was in [place].  She had an appendectomy at the age of 26 in [place].  No other hospitalizations reported.  ALLERGIES:                    She denies any allergies.  HABITS:                       No alcohol or cigarette abuse reported.  FAMILY HISTORY:               She has one brother and sister, no family history of any abnormal medical problems reported.  MEDICATIONS:                  She is only taking vitamins as well as birth control pills.  PHYSICAL EXAMINATION:  GENERAL:                      Well-developed, well-nourished female.  HEENT:                        Unremarkable.  NECK:                         Trachea midline, no carotid  bruits, no thyromegaly.  LUNGS:                        Clear to auscultation without rhonchi or wheezes.  HEART:                        Regular rate and rhythm, no murmurs or gallops.  BREASTS:                      Breast examination was done at time  of her postpartum visit on April of this year which was reported to be normal.  ABDOMEN:                      Soft, nontender without rebound or guarding.  PELVIC:                       _________ within normal limits.  Vagina and cervix, no lesions or discharge.  Uterus anteverted, normal size, shape, and consistency.  Adnexa without masses or tenderness.  RECTAL:                       Deferred.  ASSESSMENT:                   A 35 year old gravida 2, para 2, with request for elective permanent sterilization.  She was provided with the literature information from the [employer] and laparoscopic tubal sterilization procedure.  Different techniques were discussed.  Risk factors and failure rates were discussed and all questions were answered.  The patient would like to proceed with such a procedure.  We discussed the potential of failure rates of one in 600 was recorded rate.  We discussed also the potential for intra-abdominal trauma requiring corrective surgery and open laparotomy for correction.  She is also aware that this procedure is permanent.  She will not be able to have any more children.  Also in the event of a blood transfusion that there is a potential risk of antiphylactic reaction, hepatitis, and AIDS.  All questions were answered in English and in Spanish and will follow accordingly.  PLAN:                         Patient is scheduled for laparoscopic tubal sterilization procedure tomorrow, Friday, September 14, at [facility]. DD:  02/05/00 TD:  02/05/00 Job: [id number]

## 2010-10-10 NOTE — H&P (Signed)
Beth Blackwell, STANDLEE NO.:  [id number]   MEDICAL RECORD NO.:  [id number]        PATIENT TYPE:  HAMB   LOCATION:                               FACILITY:  [facility]   PHYSICIAN:  Juan H. Lily Peer, M.D.DATE OF BIRTH:  June 08, 1975   DATE OF ADMISSION:  09/22/2006  DATE OF DISCHARGE:                              HISTORY & PHYSICAL   The patient is scheduled for surgery tomorrow at [facility] at 1 p.m.   CHIEF COMPLAINT:  1. Left ovarian cyst.  2. Persistent vaginal cuff granulation tissue.   HISTORY:  The patient is a 35 year old gravida 2 para 2 who on December 29, 2006 had undergone a total abdominal hysterectomy with ovarian  conservation secondary to menometrorrhagia and septated uterus.  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  The patient did well, but postoperatively she presented to the  office complaining of pinkish bloody discharge.  It appeared she had  some granulation tissue that was present at the vaginal cuff, which had  been biopsied, and pathology report demonstrated polypoid fragment of  ulcerated granulation tissue.  There was no evidence of atypia or  malignancy, and her pathology report from her surgery did confirm the  septated uterus, but no malignancy identified.  During the most recent  evaluation in the office on September 16, 2006 because she has had some  pinkish discharge until present, and it was still in a pocket of folded  vaginal mucosa, there was still some granulation tissue, and it was  uncomfortable to excise or treat.  We are going to proceed with removing  it in the operating room, but an ultrasound had been done to rule out  the possibility of a vaginal cuff lesion or hematoma, and there was none  seen on the ultrasound, but an incidental finding of an 8 x 5.8 x 6.5  right ovarian cyst.  So, she will undergo a laparoscopic ovarian  cystectomy, with excision of the  vaginal cuff granulation tissue and  cauterization.   PAST MEDICAL HISTORY:  1. The patient denies any allergies.  2. She has had two previous cesarean sections, one in [place].  She has      a midline scar and a transverse incision from the C-section done in      the [place].  3. She has had laparoscopic tubal ligation.  4. She has had appendectomy in the past.  5. Most recently, a total abdominal hysterectomy.   She takes no medications at the present time.   FAMILY HISTORY:  Diabetes in both parents, and hypertension in both  parents as well.   PHYSICAL EXAMINATION:  VITAL SIGNS:  The patient weighs 152 pounds, 5  feet 2-1/2 inches tall.  HEENT:  Unremarkable.  NECK:  Supple.  Trachea midline.  No carotid bruits, no thyromegaly.  LUNGS:  Clear to auscultation, without any rhonchi or wheezes.  HEART:  Regular rate and rhythm.  No murmurs, rubs, or gallops.  BREASTS:  Not done.  ABDOMEN:  Soft, nontender.  No rebound or guarding.  PELVIC:  As described above.  EXTREMITIES:  DTRs 1+.  Negative clonus.   ASSESSMENT:  A 35 year old gravida 2 para 2, with total abdominal  hysterectomy last year, persistence of pinkish discharge from the  vagina, persistent granulation tissue in a fold of the vaginal mucosa.  Will undergo excision and cauterization.  An incidental finding on an  ultrasound as part of her evaluation demonstrated that she had a right  ovarian cyst which measured 8 x 5.8 x 6.5 cm.  The risks, benefits, and  pros and cons of the operation, to include infection, although she will  receive prophylactic antibiotics, and the risk for trauma to internal  organs from the laparoscopic instrument were discussed.  In the event of  such or of inaccessibility to complete the operation laparoscopically,  she is fully aware that she may end up with an open laparotomy to  complete the operation.  The risk of hemorrhage, with the potential risk  of anaphylactic reactions or  hepatitis or AIDS from blood transfusion  were discussed as well.  Also, the possibility of having to remove that  ovary, and she still has one remaining ovary on the contralateral side.  All of these issues were discussed with the patient in Spanish.  All  questions were answered, and we will follow accordingly.   PLAN:  The patient is scheduled for laparoscopic right ovarian  cystectomy and excision and cauterization of vaginal cuff granulation  tissue at [facility] on Wednesday September 22, 2006 at 1  p.m.      Juan H. Lily Peer, M.D.  Electronically Signed     JHF/MEDQ  D:  09/21/2006  T:  09/21/2006  Job:  [id number]

## 2010-10-10 NOTE — Discharge Summary (Signed)
[name], Beth Blackwell NO.:  [id number]   MEDICAL RECORD NO.:  [id number]          PATIENT TYPE:  INP   LOCATION:  9302                          FACILITY:  [facility]   PHYSICIAN:  Juan H. Lily Peer, M.D.DATE OF BIRTH:  [birth year]   DATE OF ADMISSION:  12/28/2005  DATE OF DISCHARGE:  12/30/2005                                 DISCHARGE SUMMARY   DATE OF DISCHARGE:  December 30, 2005, total days hospitalized:  2.   HISTORY:  The patient is a 35 year old gravida 2, para 2, with dysmenorrhea,  menometrorrhagia. The patient's work up had consisted of sonohysterogram  which had demonstrated a septated uterus, so the patient was not a candidate  for an endometrial ablation and the patient had multiple abdominal  surgeries, an abdominal hysterectomy had been planned.  The patient had an  IVP preoperatively which demonstrated she had a duplication of the left  collecting system and ureter.   HOSPITAL COURSE:  The patient underwent a total abdominal hysterectomy on  the morning of December 28, 2005.  Surgeon's were [employer] Lily Peer, M.D.,  first assistant Rande Brunt. [name], M.D.  The patient tolerated the  procedure well without any complications.  On postoperative day #1 her  hemoglobin and hematocrit were 12.6 and 36.2 respectively with platelet  count 265,000.  Her temperature max was 99.2.  She had an adequate urine  output.  She was started on a clear liquid diet.  That evening she was  advanced to a regular diet and tolerated the regular diet on the second  postoperative day.  She continued to remain afebrile.  Her temperature max  had been 100 and upon discharge it was 97.2.  The patient was tolerating her  regular diet and had passed flatus, was ambulating and showered.  Her  incision site was intact and she was ready to be discharged home.   FINAL DISCHARGE DIAGNOSES:  1. Dysmenorrhea.  2. Menometrorrhagia.  3. Mullerian duct defect (septated uterus).  4. 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   PROCEDURE PERFORMED:  Total abdominal hysterectomy.   FINAL DISPOSITION AND FOLLOWUP:  The patient was ready to be discharged home  on her second postoperative day.  She was up and ambulating and tolerating a  regular diet well.  She was afebrile.  She is to return to the office at the  end of the week to have her staples removed.  She had been given a  prescription for Lortab 7.5/500 to take one p.o. q.4-6h. p.r.n. pain and  Reglan 10 mg one p.o. q.4-6h. p.r.n. nausea.  Discharge instructions were  provided and will follow accordingly.  Pathology report is pending.      Juan H. Lily Peer, M.D.  Electronically Signed     JHF/MEDQ  D:  12/30/2005  T:  12/30/2005  Job:  [id number]

## 2011-03-26 ENCOUNTER — Encounter: Payer: Self-pay | Admitting: Cardiology

## 2011-03-27 ENCOUNTER — Ambulatory Visit (INDEPENDENT_AMBULATORY_CARE_PROVIDER_SITE_OTHER): Payer: 59 | Admitting: Cardiology

## 2011-03-27 ENCOUNTER — Encounter: Payer: Self-pay | Admitting: Cardiology

## 2011-03-27 VITALS — BP 110/64 | HR 66 | Ht 63.0 in | Wt 156.0 lb

## 2011-03-27 DIAGNOSIS — Z136 Encounter for screening for cardiovascular disorders: Secondary | ICD-10-CM

## 2011-03-27 DIAGNOSIS — E785 Hyperlipidemia, unspecified: Secondary | ICD-10-CM

## 2011-03-27 LAB — LIPID PANEL
Cholesterol: 198 mg/dL (ref 0–200)
HDL: 49 mg/dL (ref >39.00)
Triglycerides: 184 mg/dL — ABNORMAL HIGH (ref 0.0–149.0)

## 2011-03-27 LAB — HEPATIC FUNCTION PANEL
ALT: 27 U/L (ref 0–35)
AST: 21 U/L (ref 0–37)
Albumin: 4.5 g/dL (ref 3.5–5.2)
Alkaline Phosphatase: 58 U/L (ref 39–117)
Total Protein: 7.7 g/dL (ref 6.0–8.3)

## 2011-03-27 MED ORDER — PRAVASTATIN SODIUM 20 MG PO TABS: 20.0000 mg | ORAL_TABLET | Freq: Every day | ORAL | Status: DC

## 2011-03-27 NOTE — Patient Instructions (Signed)
Your physician recommends that you have lab work today for fasting lipid and liver enzymes  Your physician wants you to follow-up with Dr. Drue Novel regularly and with Dr. Daleen Squibb as needed. You will receive a reminder letter in the mail two months in advance. If you don't receive a letter, please call our office to schedule the follow-up appointment.

## 2011-03-27 NOTE — Progress Notes (Signed)
HPI Mrs Beth Blackwell returns today for evaluation and management of hyperlipidemia. She is doing markedly well with no symptoms of vascular disease. She has fasted today and will draw blood. She remains on pravastatin 20 mg p.o. Q.h.s.  She is not exercising on a regular basis. Her weight is increased slightly. Past Medical History  Diagnosis Date  . Hyperlipidemia, mixed   . Vitamin D deficiency   . Confusion   . Chronic headaches   . Seizures   . H/O: hysterectomy     Past Surgical History  Procedure Date  . Ovarian cyst surgery   . Cesarean section   . Appendectomy     No family history on file.  History   Social History  . Marital Status: Married    Spouse Name: N/A    Number of Children: N/A  . Years of Education: N/A   Occupational History  . Not on file.   Social History Main Topics  . Smoking status: Never Smoker   . Smokeless tobacco: Not on file  . Alcohol Use: No  . Drug Use: No  . Sexually Active: Not on file   Other Topics Concern  . Not on file   Social History Narrative  . No narrative on file    Allergies  Allergen Reactions  . Acetaminophen     Current Outpatient Prescriptions  Medication Sig Dispense Refill  . Calcium Carbonate-Vitamin D (CALTRATE 600+D) 600-400 MG-UNIT per tablet Take 1 tablet by mouth daily.        . fluticasone (FLONASE) 50 MCG/ACT nasal spray as needed.      . gabapentin (NEURONTIN) 600 MG tablet Take 600 mg by mouth 3 (three) times daily.        Marland Kitchen levETIRAcetam (KEPPRA) 500 MG tablet Take 500 mg by mouth. 1 tablet every morning and 2 tabs qhs        . OMEGA 3 1000 MG CAPS Take by mouth.        . pravastatin (PRAVACHOL) 20 MG tablet Take 20 mg by mouth daily.          ROS Negative other than HPI.   PE General Appearance: well developed, well nourished in no acute distress HEENT: symmetrical face, PERRLA, good dentition  Neck: no JVD, thyromegaly, or adenopathy, trachea midline Chest: symmetric without  deformity Cardiac: PMI non-displaced, RRR, normal S1, S2, no gallop or murmur Lung: clear to ausculation and percussion Vascular: all pulses full without bruits  Abdominal: nondistended, nontender, good bowel sounds, no HSM, no bruits Extremities: no cyanosis, clubbing or edema, no sign of DVT, no varicosities  Skin: normal color, no rashes Neuro: alert and oriented x 3, non-focal Pysch: normal affect Filed Vitals:   03/27/11 0931  BP: 110/64  Pulse: 66  Height: 5\' 3"  (1.6 m)  Weight: 156 lb (70.761 kg)    EKG Normal sinus rhythm, low voltage, otherwise normal Labs and Studies Reviewed.   Lab Results  Component Value Date   WBC 7.4 09/21/2009   HGB 11.8* 06/25/2010   HCT 35.7* 09/21/2009   MCV 91.2 09/21/2009   PLT 214 09/21/2009      Chemistry      Component Value Date/Time   NA 141 09/21/2009 0755   K 3.6 09/21/2009 0755   CL 109 09/21/2009 0755   CO2 24 09/21/2009 0755   BUN 14 09/21/2009 0755   CREATININE 0.67 09/21/2009 0755      Component Value Date/Time   CALCIUM 9.0 09/21/2009 0755   ALKPHOS  50 02/24/2010 1007   AST 17 02/24/2010 1007   ALT 18 02/24/2010 1007   BILITOT 0.7 02/24/2010 1007       Lab Results  Component Value Date   CHOL 151 02/24/2010   CHOL 194 02/04/2010   CHOL 92 12/05/2009   Lab Results  Component Value Date   HDL 37.30* 02/24/2010   HDL 33.80* 02/04/2010   HDL 35.10* 12/05/2009   Lab Results  Component Value Date   LDLCALC 86 02/24/2010   LDLCALC 34 12/05/2009   LDLCALC 6.7 04/13/2007   LDLCALC 142 04/13/2007   Lab Results  Component Value Date   TRIG 137.0 02/24/2010   TRIG 218.0* 02/04/2010   TRIG 116.0 12/05/2009   Lab Results  Component Value Date   CHOLHDL 4 02/24/2010   CHOLHDL 6 02/04/2010   CHOLHDL 3 12/05/2009   No results found for this basename: HGBA1C   Lab Results  Component Value Date   ALT 18 02/24/2010   AST 17 02/24/2010   ALKPHOS 50 02/24/2010   BILITOT 0.7 02/24/2010   Lab Results  Component Value Date   TSH 1.67  06/18/2006

## 2011-03-27 NOTE — Assessment & Plan Note (Signed)
Drug lipids today. Followup p.r.n. She will follow with primary care with Dr Drue Novel.

## 2011-04-10 ENCOUNTER — Encounter: Payer: 59 | Admitting: Gynecology

## 2011-04-10 ENCOUNTER — Encounter: Payer: Self-pay | Admitting: Gynecology

## 2011-04-10 ENCOUNTER — Other Ambulatory Visit (HOSPITAL_COMMUNITY)
Admission: RE | Admit: 2011-04-10 | Discharge: 2011-04-10 | Disposition: A | Discharge status: Home / Self Care | Payer: 59 | Source: Ambulatory Visit | Attending: Gynecology | Admitting: Gynecology

## 2011-04-10 ENCOUNTER — Ambulatory Visit (INDEPENDENT_AMBULATORY_CARE_PROVIDER_SITE_OTHER): Payer: 59 | Admitting: Gynecology

## 2011-04-10 VITALS — BP 118/76 | Ht 62.25 in | Wt 157.0 lb

## 2011-04-10 DIAGNOSIS — D573 Sickle-cell trait: Secondary | ICD-10-CM | POA: Insufficient documentation

## 2011-04-10 DIAGNOSIS — R635 Abnormal weight gain: Secondary | ICD-10-CM

## 2011-04-10 DIAGNOSIS — Z01419 Encounter for gynecological examination (general) (routine) without abnormal findings: Secondary | ICD-10-CM | POA: Insufficient documentation

## 2011-04-10 DIAGNOSIS — Z23 Encounter for immunization: Secondary | ICD-10-CM

## 2011-04-10 DIAGNOSIS — Z833 Family history of diabetes mellitus: Secondary | ICD-10-CM

## 2011-04-10 DIAGNOSIS — E559 Vitamin D deficiency, unspecified: Secondary | ICD-10-CM

## 2011-04-10 NOTE — Patient Instructions (Signed)
Dietary therapy for weight gain   INTRODUCTION - The optimal management of overweight and obesity requires a combination of diet, exercise, and behavioral modification. In addition, some patients eventually require pharmacologic therapy or bariatric surgery. The risk of overweight to the subject should be evaluated before beginning any treatment program. Selection of treatment can then be made using a risk-benefit assessment). The choice of therapy is dependent on several factors including the degree of overweight or obesity and patient preference.  This topic will review the dietary therapy of obesity. Other aspects of treatment are discussed separately. (See "Health hazards associated with obesity in adults" and "Overview of therapy for obesity in adults" and "Drug therapy of obesity" and "Behavioral strategies in the treatment of obesity".) GOALS OF WEIGHT LOSS - It is important to set goals when discussing a dietary weight loss program with an individual patient. An initial weight loss goal of 5 to 7 percent of body weight is realistic for most individuals. The first goal for any overweight individual is to prevent further weight gain and keep body weight stable (within 5 pounds of its current level).  The goal of the clinician is to identify and review with the patient a realistic weight-loss goal. Most patients have a weight loss goal of 30 percent or more below current weight, which is unrealistic [1].  A successful program will lead to a weight loss of more than 5 percent of initial weight [2]. A weight loss of more than 5 percent can reduce risk factors for cardiovascular disease, such as dyslipidemia, hypertension, and diabetes mellitus [3]. In the Diabetes Prevention Program, a multi-center trial in patients with impaired glucose tolerance, weight loss of 7 percent reduced the rate of progression from impaired glucose tolerance to diabetes by 58 percent  [4]. (See "Prediction and prevention of type 2 diabetes mellitus", section on 'Diabetes Prevention Program'.)  Loss of 5 percent of initial body weight and maintenance of this loss is a good medical result, even if the subject does not reach his or her "dream" weight.  Although an extremely difficult goal to achieve, a body mass index (BMI) between 20 and 25 kg/m2 puts the subject in the lowest risk category (table 1 and figure 1). DIETARY ENERGY Rate of weight loss - The rate of weight loss is directly related to the difference between the subject's energy intake and energy requirements. Reducing caloric intake below expenditure results in a predictable initial rate of weight loss that is related to the energy deficit [5,6]. However, prediction of weight loss for an individual subject can be difficult because of marked intersubject variability in initial body composition, adherence, and energy expenditure [5,7]. Food records are often inaccurate. Most normal-weight people under-report what they eat by 10 to 30 percent, while overweight people under-report by 30 percent or more [8]. In addition, energy requirements are influenced by fidgeting, gender, age, and genetic factors [5,6,9]. As examples: Men lose more weight than women of similar height and weight when they comply with eating any given diet because men have more lean body mass, less percent body fat, and therefore higher energy expenditure.  Older subjects of either sex have a lower energy expenditure and therefore lose weight more slowly than younger subjects; metabolic rate declines by approximately 2 percent per decade (about 100 kcal/decade) [10].  The importance of genetic factors is illustrated by a study of identical female twin pairs who were overfed to induce weight gain [11]. Twelve twin pairs were overfed by 1000 kcal/day for  84 of 100 days. The degree of weight gain at a constant dietary caloric increment varied widely among the twin pairs  (from 4.3 to 13.3 kg), in fact, there was three times the variance for both weight and fat mass among the twin pairs when compared with that within the twin pairs. Approximately 22 kcal/kg is required to maintain a kilogram of body weight in a normal adult. Thus, the expected or calculated energy expenditure for a woman weighing 100 kg is approximately 2200 kcal/day. The variability of 20 percent could give energy needs as high as 2620 kcal/day or as low as 1860 kcal/day. An average deficit of 500 kcal/day should result in an initial weight loss of approximately 0.5 kg/week (1 lb/week). However, after three to six months of weight loss, energy expenditure adaptations occur, which slow the bodyweight response to a given change in energy intake, thereby diminishing ongoing weight loss [7]. There are several methods of formally estimating energy expenditure; we suggest using the WHO criteria (table 2). This method allows a direct estimate of resting metabolic rate (RMR) and calculation of daily energy requirement. The low activity level (1.3 x RMR) includes subjects who lead a sedentary life. The high activity level (1.7 x RMR) applies to those in jobs requiring manual labor or patients with regular daily physical exercise programs [12]. Maintenance of weight loss - It is important for the overweight subject to understand that achieving and maintaining weight loss is made difficult by the reduction in energy expenditure that is induced by weight loss (figure 2) [13]. Weight loss maintenance is also difficult because of changes in the peripheral hormone signals that regulate appetite. Gastrointestinal peptides, such as ghrelin, which stimulates appetite, and gastric inhibitory polypeptide, which may promote energy storage, increase after diet-induced weight loss. Other circulating mediators that inhibit intake (eg, leptin, peptide YY, cholecystokinin, pancreatic polypeptide) decrease. These hormonal adaptations  favoring weight gain persist for at least one year after diet-induced weight loss [14]. (See "Overview of therapy for obesity in adults", section on 'Maintenance of weight loss' and "Pathogenesis of obesity", section on 'Ghrelin'.)  TYPES OF DIETS - The general consensus is that excess intake of calories from any source, associated with a sedentary lifestyle, causes weight gain and obesity. The goal of dietary therapy, therefore, is to decrease energy intake from food. Conventional diets are defined as those below energy requirements but above 800 kcal/day [15]. These diets fall into four groups: Balanced low-calorie diets/portion-controlled diets  Low-fat diets  Low-carbohydrate diets  Mediterranean diet  Fad diets (diets involving unusual combinations of foods or eating sequences) Commercial weight loss programs and internet-based programs are discussed elsewhere. (See "Behavioral strategies in the treatment of obesity".) Balanced low-calorie diets - Planning a diet requires the selection of a caloric intake and then selection of foods to meet this intake. It is desirable to eat foods with adequate nutrients in addition to protein, carbohydrate, and essential fatty acids. Thus, weight-reducing diets should eliminate alcohol, sugar-containing beverages, and most highly concentrated sweets because they rarely contain adequate amounts of other nutrients besides energy. Breakdown of some protein is to be expected during weight loss. When weight increases as a result of overeating, approximately 75 percent of the extra energy is stored as fat and the remaining 25 percent as lean tissue. If the lean tissue contains 20 percent protein, then 5 percent of the extra weight gain would be protein. Thus, it should be anticipated that during weight loss, at least 5 percent of weight loss will  be protein. A desirable feature of any calorie restricted diet, however, is that it results in the lowest possible loss of  protein, recognizing that this will not be less than 5 percent of the weight that is lost. Portion-controlled diets - One simple approach to providing a calorie-controlled diet is to use individually packaged foods, such as formula diet drinks using powdered or liquid formula diets, nutrition bars, frozen food, and pre-packaged meals that can be stored at room temperature as the main source of nutrients. Frozen low-calorie meals containing 250 to 350 kcal/package can be a convenient and nutritious way to do this. We have often recommended the use of formula diets or breakfast bars for breakfast, formula diets or a frozen lunch entree for lunch, and a frozen calorie-controlled entree with additional vegetables for dinner. In this way, it is possible to obtain a calorie-controlled 1000 to 1500 kcal per day diet. In one four-year study this approach resulted in early initial weight loss, which then was maintained [16]. I do not recommend the use of formula diets alone because they do not provide adequate nutritional variety. Low-fat diets - Low-fat diets are another standard strategy to help patients lose weight, and almost all dietary guidelines recommend a reduction in the daily intake of fat to 30 percent of energy intake or less [17,18]. In a meta-analysis of trials comparing low-fat diets (typically 20 to 25 percent of energy from fats) with a control group consuming a usual diet or a medium fat diet (usually 35 to 40 percent of energy), there was greater weight loss (approximately 3 kg) with low-fat compared with moderate fat diets [19]. In addition, one report noted that people who successfully keep their weight reduced adopt three strategies, one of which is eating a lower fat diet [20]. (See "Dietary fat" and "Etiology and natural history of obesity", section on 'Dietary habits'.) A low-fat dietary pattern with healthy carbohydrates is not associated with weight gain. This was illustrated by the [facility] Dietary Modification Trial of [phone] postmenopausal women over age 101 years who were randomly assigned to a dietary intervention that included group and individual sessions to promote a decrease in fat intake and increases in fruit, vegetable, and grain consumption (healthy carbohydrates), but did not include weight loss or caloric restriction goals, or a control group which received only dietary educational materials [21]. After an average of 7.5 years of follow-up, the following results were seen: Women in the intervention group lost weight in the first year (mean of 2.2 kg) and maintained lower weight than the control women at 7.5 years (difference of 1.9 kg at one year, and 0.4 kg at 7.5 years).  No tendency toward weight gain was seen in the intervention group overall, or when stratified by age, ethnicity, or body mass index.  Weight loss was related to the level of fat intake and was greatest in women who decreased their percentage of energy from fat the most. A similar, but lesser trend was seen with increased vegetable and fruit intake. A low-fat diet can be implemented in two ways. First, the dietitian can provide the subject with specific menu plans that emphasize the use of reduced fat foods. As one guideline, if a food "melts" in your mouth, it probably has fat in it. Second, subjects can be instructed in counting fat grams as an alternative to counting calories. Fat has 9.4 kcal/g. It is thus very easy to calculate the number of grams of fat a subject can eat for  any given level of energy intake. Many experts recommend keeping calories from fat to below 30 percent of total calories. In practical terms, this means eating about 33 g of fat for each 1000 calories in the diet. For simplicity, I use 30 g of fat or less for each 1000 kcal. For a 1500-calorie diet, this would mean about 45 g or less of fat, which can be counted using the nutrition information labels on food  packages. Low-carbohydrate diets - Proponents of low-carbohydrate diets have argued that the increasing obesity epidemic may be in part due to low-fat, high-carbohydrate diets. But this may be dependent upon the type of carbohydrates that are eaten, such as energy dense snacks and sugar or high fructose containing beverages. The carbohydrate content of the diet is an important determinant of short-term (less than two weeks) weight loss. Low (60 to 130 grams of carbohydrates) and very low-carbohydrate diets (0 to <60 grams) have been popular for many years [15]. Restriction of carbohydrates leads to glycogen mobilization and, if carbohydrate intake is less than 50 g/day, ketosis will develop. Rapid weight loss occurs, primarily due to glycogen breakdown and fluid loss rather than fat loss. Low and very low-carbohydrate diets are more effective for short-term weight loss than low-fat diets, although probably not for long-term weight loss. A meta-analysis of five trials found that the difference in weight loss at six months, favoring the low carbohydrate over low fat diet, was not sustained at 12 months [22]. (See 'Comparison trials' below.) Low-carbohydrate diets may have some other beneficial effects with regard to risk of developing type 2 diabetes mellitus, coronary heart disease, and some cancers, particularly if attention is paid to the type as well as the quantity of carbohydrate. A low-carbohydrate diet can be implemented in two ways, either by reducing the total amount of carbohydrate or by consuming foods with a lower glycemic index or glycemic load (table 3). Glycemic index and load are reviewed separately. (See "Dietary carbohydrates", section on 'Glycemic index'.) If a low-carbohydrate diet is chosen, healthy choices for fat (mono- and polyunsaturated fats) and protein (fish, nuts, legumes, and poultry) should be encouraged because of the association between saturated fat intake and risk of coronary  heart disease. During 26 years of follow-up of women in the Nurses' Health Study and 20 years of follow-up of men in the Health Professionals' Follow-up Study, low carbohydrate diets in the highest versus lowest decile for vegetable proteins and fat were associated with lower all-cause mortality (HR 0.80, 95% CI 0.75-0.85) and cardiovascular mortality (HR 0.77, 95% CI 0.68-0.87) [23]. In contrast, low carbohydrate diets in the highest versus lowest decile for animal protein and fat were associated with higher all-cause (HR 1.23, 95% CI 1.11-1.37) and cardiovascular (HR 1.14, 95% CI 1.01-1.29) mortality. (See "Dietary fat" and "Overview of primary prevention of coronary heart disease and stroke", section on 'Healthy diet'.) High protein diets - Some popular books recommend high protein diets [24]. In one trial, low-fat diets with 12 percent and 25 percent protein content were compared. Weight loss over six months was greater with the higher protein diet (9 versus 5 kg), but the difference was no longer significant at 12 and 24 months [25]. Higher protein diets may improve weight maintenance, as illustrated by the results of a study of 60 subjects randomly assigned to a low fat, high protein versus low-fat, high-carbohydrate diet after completing a four week very low calorie diet [26]. Among the subjects who completed the three-month study (n = 48), the  high protein diet group had significantly better weight maintenance (between group difference of 2.3 kg). High dietary protein intake, due to its acid-producing load, increases urinary calcium excretion (with potential risk for bone loss and calcium stone formation) [27]. Urinary calcium excretion does appear to increase when dietary intake of protein increases [27-29], and this could pose a long-term risk for nephrolithiasis. (See "Risk factors for calcium stones in adults", section on 'Dietary risk factors'.) However, two small randomized trials that looked at  bone metabolism found evidence that increased dietary protein may decrease bone resorption [28,29]. One of the trials found that increased intestinal absorption of calcium was primarily responsible for the increased urinary excretion of calcium and that the excreted calcium was not coming from bone [29]. Mediterranean diet - The term Mediterranean diet refers to a dietary pattern that is common in [place] areas of the [place] area. Although there is some variation in Mediterranean diets, there are some common components that include a high level of monounsaturated fat relative to saturated; moderate consumption of alcohol, mainly as wine; a high consumption of vegetables, fruits, legumes, and grains; a moderate consumption of milk and dairy products, mostly in the form of cheese; and a relatively low intake of meat and meat products. A meta-analysis of 12 studies involving eight cohorts found that a Mediterranean diet was associated with improved health status and reductions in overall mortality, cardiovascular mortality, cancer mortality, and incidence of Parkinson's disease and Alzheimer's disease [30]. (See "Healthy diet in adults", section on 'Mediterranean diet'.) Very low-calorie diets - Diets with energy levels between 200 and 800 kcal/day are called "very low-calorie diets," while those below 200 kcal/day can be termed starvation diets. The basis for these diets was the notion that the lower the calorie intake the more rapid the weight loss, because the energy withdrawn from body fat stores is a function of the energy deficit. Starvation is the ultimate very low-calorie diet and results in the most rapid weight loss. Although once popular, starvation diets are now rarely used for treatment of obesity. Very low-calorie diets have not been shown to be superior to conventional diets for long-term weight loss. In a meta-analysis of six trials comparing very low-calorie diets with conventional  low-calorie diets, short-term weight loss was greater with very low-calorie diets (16.1 versus 9.7 versus percent of initial weight), but there was no difference in long-term weight loss (6.3 versus 5.0 percent) [31]. As with all diets, very low-calorie diets initially result in substantial protein loss that diminishes with time. Other expected effects include reduction in blood pressure and improvement in hyperglycemia in diabetic patients. Subjects adhering to very low-calorie diets usually have a fall in blood pressure, especially during the first week. Antihypertensive drugs, especially calcium channel blockers and diuretics, should usually be discontinued when a very low calorie diet is begun unless moderate to severe hypertension is present.  Most diabetic patients eating very low-calorie diets have marked improvement in hyperglycemia. Blood glucose concentrations fall within the first one to two weeks, and remain lower as long as the diet is continued. Those patients taking less than 50 units of insulin or an oral hypoglycemic drug will usually be able to discontinue therapy [32]. The side effects of very low-calorie diets include hair loss, thinning of the skin, and coldness. These diets are contraindicated for lactating and pregnant women, and in children who require protein for linear growth. As with all diets, there is increased cholesterol mobilization from peripheral fat stores, thus increasing the risk of  gallstones. Very low-calorie diets should be reserved for subjects who require rapid weight loss for a specific purpose, such as surgery. The weight regain when the diet is stopped is often rapid, and it is better to take a more sustainable approach than to use a method that cannot be sustained. Comparison trials - The impact of specific dietary composition on weight change remains uncertain. When energy from dietary carbohydrates decreases, energy from fat sources tends to increase. The reverse  is also true; when energy from dietary fats decreases, energy from carbohydrate sources tends to increase. The debate has mainly centered on whether low-fat or low-carbohydrate diets can better induce weight loss and sustain it over the long-term. Weight loss diets - Initial trials evaluating the effect of type of diet (predominantly low-carbohydrate versus low-fat) on weight loss and other outcomes showed that weight loss at six months was approximately 4 kg greater in the very low-carbohydrate group than in the low-fat group [33-35]. Trials lasting for one year, however, did not find a significant difference in weight loss [34,36,37]. A meta-analysis of five trials (including one study not referenced above) found that the difference in weight loss at six months, favoring the low carbohydrate over low fat diet, was not sustained at 12 months [22]. In one study, this convergence was mainly due to regain of weight in the low-carbohydrate group [34]; in another, the convergence was due to ongoing weight loss in the low-fat group (figure 3) [36]. Some of these initial comparison trials of different dietary regimens had important limitations [22]. These included high dropout rates (21 to 48 percent), suboptimal dietary compliance, and limited long-term follow-up. Subsequent trials are larger, of longer duration (lasting one to two years), and have conflicting results with regard to the impact of macronutrient composition on weight loss [38-41]. In contrast, all trials found that dietary adherence is an important determinant of weight loss, independent of macronutrient composition. The following observations illustrate the range of findings in these trials: In one trial, 322 moderately obese subjects (86 percent men) were randomly assigned to a low-fat (restricted calorie), Mediterranean (moderate-fat, restricted calorie, rich in vegetables, low in red meat), or low-carbohydrate (non-restricted-calorie) diet for two  years [38]. Adherence rates were higher than those reported in previous trials (95.4 and 84.6 percent at one and two years, respectively). Weight loss was greater with the Mediterranean and low-carbohydrate diets than the low-fat diet (mean weight loss 4.4, 4.7, and 2.9 kg, respectively).  The most favorable effect on lipids (increased HDL and decreased triglycerides and ratio of total cholesterol to HDL) was seen in the low-carbohydrate group. Among subjects with type 2 diabetes, the greatest improvement in glycemic control occurred with the Mediterranean diet. Among all groups, weight loss was greater for those who completed the two year study than for those who withdrew.  Another randomized trial compared four different diets in 311 overweight and obese premenopausal women: very low-carbohydrate (Atkins); macronutrient balance controlling glycemic load (Zone); general calorie restriction, low-fat (LEARN); and very low-fat (Ornish) [39]. In the intention-to-treat analysis at one year, mean weight loss was greater in the Atkins diet group compared with the other groups (4.7, 1.6, 2.2, and 2.6 kg, respectively). Pairwise comparisons showed a significant difference only for Atkins versus Zone.  The most favorable effect on triglycerides and HDL-C was seen in the Atkins group. Dietary adherence rates (77 to 88 percent) were similar among the groups and better than in previous trials. Within each group, adherence was significantly associated with weight loss [42].  In the largest trial to date, 811 overweight and obese adults were randomly assigned to one of four diets based upon macronutrient content: low or high fat (20 to 40 percent), which provided carbohydrate at 35, 45, 55, or 65 percent, and high or average protein (15 to 25 percent) [40]. After six months, mean weight loss in each group was 6 kg. By two years, mean weight loss was 3 to 4 kg, and weight losses remained similar in all groups. Many  participants had trouble attaining target levels of macronutrients. Subjects who attended the greatest number of group sessions (most adherent) lost the most weight. Thus, any diet that is adhered to will produce modest weight loss, but adherence rates are low with most diets. Although a low-carbohydrate diet may be associated with greater short-term weight loss, superior weight loss in the long-term has not been established. The optimal mix of macronutrients likely depends upon individual factors [43]. A principal determinant of weight loss appears to be the degree of adherence to the diet, irrespective of the particular macronutrient composition [37,39,40,42,44,45]. Thus, we suggest choosing a macronutrient mix based upon patient preferences, which may improve long-term adherence. Behavioral modification to improve dietary compliance with any type of diet may have the greatest impact on long-term weight loss. (See "Behavioral strategies in the treatment of obesity".) Lipids - The observed effects on blood lipids were similar for trials comparing low fat and very low carbohydrate diets [22,33-36,46]; the low-carbohydrate/high-fat diets caused slight increases in HDL, and greater decreases in fasting triglycerides. At 12 to 24 months, however, the favorable effects on HDL persisted [34,36,37,41], while triglyceride levels were either reduced [34,36] or returned to baseline [37]. In a meta-analysis of trials comparing low-carbohydrate and low-fat diet groups, LDL levels were increased in the low-carbohydrate group [22]. There was no clear benefit of either low-fat or low-carbohydrate diet on cardiovascular risks. Favorable changes in HDL cholesterol and triglycerides should be weighed against potential unfavorable changes in LDL cholesterol.  Side effects - Very low-carbohydrate diets may be associated with more frequent side effects than low-fat diets. In one of the trials noted above, a number of symptoms  occurred significantly more frequently in the low-carbohydrate compared to the low-fat diet group [33]. These included constipation (68 versus 35 percent), headache (60 versus 40 percent), halitosis (38 versus 8 percent), muscle cramps (35 versus 7 percent), diarrhea (23 versus 7 percent), general weakness (25 versus 8 percent), and rash (13 versus 0 percent) [33]. Despite the higher rate of symptoms, dropout rates in clinical trials have been similar for low-carbohydrate and low-fat diets [34-36]. Some have raised the concern about ketosis that occurs with very low-carbohydrate diets. There is one case report of an obese patient who presented in severe ketoacidosis, having lost 9 kg in one month on the Atkins diet, with intake restricted to meat, cheese, and salads [47]. Aside from her diet and possible mild dehydration due to gastroenteritis, no other cause for her ketoacidosis was identified. Weight maintenance diets - Although many individuals have success losing weight with diet, most subsequently regain much or all of the lost weight. Maintaining weight loss is made difficult by the reduction in energy expenditure that is induced by weight loss. In addition, long-term adherence to restrictive diets is difficult. Exercise and behavioral interventions may help individuals maintain weight loss. These strategies are reviewed in detail elsewhere. (See "Role of physical activity and exercise in obesity", section on 'Maintenance of weight loss' and "Behavioral strategies in the treatment of obesity", section  on 'Maintenance of weight loss'.) There is little consensus on the optimal mix of macronutrients to maintain weight loss. The satiating effects of high protein, low glycemic index diets have generated interest in manipulating protein composition and glycemic index in weight maintenance diets. (See 'High protein diets' above and "Dietary carbohydrates", section on 'Effect of glycemic index/glycemic load'.) In a  multicenter trial of five ad libitum diets to prevent weight regain over 26 weeks, 773 adults who had successfully lost 8 percent of their body weight on a low calorie diet (800 to 1000 kcal/day), were randomly assigned in a two-by-two factorial design to a high or low-protein (25 versus 13 percent of total calories), high or low-glycemic index, or to a control diet (moderate protein content) [48]. All diets had a moderate fat content (25 to 30 percent). The achieved protein content was 5 percentage points higher in the high versus low protein groups, and the mean glycemic index was five units lower in the low-glycemic versus high-glycemic index groups. In the intention-to-treat analysis, weight regain during the trial was modestly but significantly greater in the low versus high-protein groups (mean difference 0.93 kg) and in the high versus low-glycemic index groups (mean difference 0.95 kg). Only subjects in the high-protein, low-glycemic index diet group continued to lose weight (mean change -0.38 kg). The trial was limited by the moderate dropout rate (29 percent) and short-term follow-up (six months). Whether a low glycemic index, high protein diet is associated with long-term weight maintenance is unknown. As discussed above, long-term adherence to a weight maintaining diet is probably the most important determinant of success, and therefore the optimal weight maintaining diet will depend upon preference and individual factors. Role of dietary counseling - Dietary counseling may produce modest, short-term weight losses. This topic is reviewed in detail elsewhere. (See "Behavioral strategies in the treatment of obesity", section on 'Elements of behavioral strategies' and "Behavioral strategies in the treatment of obesity", section on 'Efficacy'.)  Prolonged caloric restriction and longevity - Prolonged caloric restriction improves longevity in rodents and non-human primates [49], but it is not known if the  same is true in humans. It is hypothesized that the antiaging effects of caloric restriction are due to reduced energy expenditure resulting in a reduction in production of reactive oxygen species (and therefore a reduction in oxidative damage). In addition, other metabolic effects associated with caloric restriction, such as improved insulin sensitivity, might also have an antiaging effect. In one trial of 48 sedentary, overweight men and women, six months of caloric restriction, with or without exercise, resulted in significant weight loss as expected [50]. In addition, calorie restriction-mediated reductions in fasting insulin concentrations, core body temperature, serum T3 levels, and oxidative damage to DNA (as reflected by a reduction in DNA fragmentation) were seen, suggesting a possible antiaging effect of the prolonged caloric restriction. INFORMATION FOR PATIENTS - UpToDate offers two types of patient education materials, "The Basics" and "Beyond the Basics." The Basics patient education pieces are written in plain language, at the 5th to 6th grade reading level, and they answer the four or five key questions a patient might have about a given condition. These articles are best for patients who want a general overview and who prefer short, easy-to-read materials. Beyond the Basics patient education pieces are longer, more sophisticated, and more detailed. These articles are written at the 10th to 12th grade reading level and are best for patients who want in-depth information and are comfortable with some medical jargon. Here are the patient  education articles that are relevant to this topic. We encourage you to print or e-mail these topics to your patients. (You can also locate patient education articles on a variety of subjects by searching on "patient info" and the keyword(s) of interest.)  Basics topics (see "Patient information: Diet and health (The Basics)" and "Patient information: Weight loss  treatments (The Basics)")  Beyond the Basics topics (see "Patient information: Diet and health (Beyond the Basics)" and "Patient information: Weight loss treatments (Beyond the Basics)" and "Patient information: Weight loss surgery (Beyond the Basics)")  SUMMARY AND RECOMMENDATIONS An initial weight loss goal of 5 to 7 percent of body weight is realistic for most individuals. (See 'Goals of weight loss' above.)  Many types of diets produce modest weight loss. Options include balanced low-calorie, low-fat low-calorie, moderate-fat low calorie, low-carbohydrate diets, and the Mediterranean diet. Dietary adherence is an important predictor of weight loss, irrespective of the type of diet. (See 'Types of diets' above.)  We suggest tailoring a diet that reduces energy intake below energy expenditure to individual patient preferences, rather than focusing on the macronutrient composition of the diet (Grade 2B). (See 'Comparison trials' above.)  If a low-carbohydrate diet is chosen, healthy choices for fat (mono and polyunsaturated) and protein (fish, nuts, legumes, and poultry) should be encouraged. If a low-fat diet is chosen, the decrease in fat should be accompanied by increases in healthy carbohydrates (fruits, vegetables, whole grains).

## 2011-04-10 NOTE — Progress Notes (Signed)
Addended by: Bertram Savin A on: 04/10/2011 09:56 AM   Modules accepted: Orders

## 2011-04-10 NOTE — Progress Notes (Signed)
Beth Blackwell 04/05/1976 [id number]   History:    35 y.o.  for annual exam with no complaints. Patient been under the care of Dr.Wall who is been monitoring her hypercholesterolemia. She had competence metabolic panel and lipid profile this office 2 weeks ago. Patient with past history vitamin D deficiency which was corrected. She is taking calcium and vitamin D twice a day. Her mammogram was in February this year which was normal. And she frequently does her self breast examination. Review of her record indicates she was weighing 142 pounds up to 157. Patient with past history of total done hysterectomy with ovarian conservation. Patient been followed also by [facility] neurology for her seizure disorder. She's been followed every 6 months. Patient denies any recent seizure episodes.  Past medical history,surgical history, family history and social history were all reviewed and documented in the EPIC chart.  Gynecologic History No LMP recorded. Patient has had a hysterectomy. Contraception: none Last Pap: 2011. Results were: normal Last mammogram: 2011 negative. Results were: Fatty tissue and fiber glandular densities otherwise normal.  Obstetric History OB History    Grav Para Term Preterm Abortions TAB SAB Ect Mult Living   2 2             # Outc Date GA Lbr Len/2nd Wgt Sex Del Anes PTL Lv   1 PAR      LTCS      2 PAR      LTCS          ROS:  Was performed and pertinent positives and negatives are included in the history.  Exam: chaperone present  BP 118/76  Ht 5' 2.25" (1.581 m)  Wt 157 lb (71.215 kg)  BMI 28.49 kg/m2  Body mass index is 28.49 kg/(m^2).  General appearance : Well developed well nourished female. No acute distress HEENT: Neck supple, trachea midline, no carotid bruits, no thyroidmegaly Lungs: Clear to auscultation, no rhonchi or wheezes, or rib retractions  Heart: Regular rate and rhythm, no murmurs or gallops Breast:Examined in sitting and supine position  were symmetrical in appearance, no palpable masses or tenderness,  no skin retraction, no nipple inversion, no nipple discharge, no skin discoloration, no axillary or supraclavicular lymphadenopathy Abdomen: no palpable masses or tenderness, no rebound or guarding Extremities: no edema or skin discoloration or tenderness  Pelvic:  Bartholin, Urethra, Skene Glands: Within normal limits             Vagina: No gross lesions or discharge  Cervix: Absent  Uterus absent  Adnexa  Without masses or tenderness  Anus and perineum  normal   Rectovaginal  normal sphincter tone without palpated             masses or tenderness             Hemoccult not done     Assessment/Plan:  35 y.o. female for annual exam asymptomatic today. Dissection vitamin D deficiency in the past we'll check her vitamin D level today. She has a strong family history diabetes in her family and since she is fasting we'll do a fasting blood sugar today along with her CBC urinalysis and Pap smear. She is encouraged her monthly self breast examination. Appropriate diet and exercise on a regular basis was discussed with the patient instructions were provided as well. Patient requesting have her flu shot today consent form was signed and was administered.    Ok Edwards MD, 9:41 AM 04/10/2011

## 2011-07-03 ENCOUNTER — Ambulatory Visit (INDEPENDENT_AMBULATORY_CARE_PROVIDER_SITE_OTHER): Payer: 59 | Admitting: Internal Medicine

## 2011-07-03 VITALS — BP 110/74 | HR 81 | Temp 98.7°F | Wt 159.0 lb

## 2011-07-03 DIAGNOSIS — J069 Acute upper respiratory infection, unspecified: Secondary | ICD-10-CM

## 2011-07-03 DIAGNOSIS — G40909 Epilepsy, unspecified, not intractable, without status epilepticus: Secondary | ICD-10-CM

## 2011-07-03 DIAGNOSIS — E785 Hyperlipidemia, unspecified: Secondary | ICD-10-CM

## 2011-07-03 MED ORDER — AMOXICILLIN 500 MG PO CAPS: 1000.0000 mg | ORAL_CAPSULE | Freq: Two times a day (BID) | ORAL | Status: AC

## 2011-07-03 NOTE — Patient Instructions (Signed)
Rest, fluids  For cough, take Mucinex DM twice a day as needed  For congestion use Dymista 2 sprays on each side of the nose once a day until sample is gone  Take the antibiotic as prescribed ----> Amoxicillin Call if no better in few days Call anytime if the symptoms are severe

## 2011-07-03 NOTE — Progress Notes (Signed)
  Subjective:    Patient ID: Beth Blackwell, female    DOB: 12-Jul-1975, 36 y.o.   MRN: [id number]  HPI New patient, at last visit more than 3 years ago. Hyperlipidemia, used to be seen at the cholesterol clinic, was recommend see  primary doctor for further management. Since the last time I saw her, she had seizures, currently followed by our local neurology group. Also complains of upper respiratory symptoms for the last 3 days. Mostly runny nose, sore throat. In the last couple of days she has developed right facial pain as well.  Past Medical History: Hyperlipidemia H/o vit D def  H/o seizures, ~ 2011, f/u locally  Allergies, on shots Dr Beaulah Dinning   Past Surgical History: Hysterectomy  Ovary cyst removed c-section x 2 Appendectomy R wrist surgery Dr Merlyn Lot 06-2010  Family History: Diabetes--mother Hypertension--mother and father  no family history of heart disease   Social History: Original from [place], [place] Married, 2 children Tobacco Use - No.  Alcohol Use - no Regular Exercise - no Drug Use - no  Review of Systems Denies any fevers or chills, she does have cough on and off. Mild chest congestion, she takes Proventil as prescribed by her allergist and has been using it from time to time. As far as cholesterol, good compliance with Pravachol, she has some back pain, unsure if it's related to her cholesterol medication.    Objective:   Physical Exam  Constitutional: She is oriented to person, place, and time. She appears well-developed and well-nourished.  HENT:  Head: Normocephalic and atraumatic.  Right Ear: External ear normal.  Left Ear: External ear normal.  Mouth/Throat: No oropharyngeal exudate.       No se congested, face symmetric, no TTP.  Cardiovascular: Normal rate, regular rhythm and normal heart sounds.   No murmur heard. Pulmonary/Chest: Effort normal and breath sounds normal. No respiratory distress. She has no wheezes. She has no rales.    Musculoskeletal: She exhibits no edema.  Neurological: She is alert and oriented to person, place, and time.       Assessment & Plan:  URI, see instructions

## 2011-07-03 NOTE — Assessment & Plan Note (Signed)
Used to be under the care off the lipid clinic. At some point took Crestor but it caused aches and pains. Currently taking Pravachol 20 mg, and the only pain she has is occasional low back pain.

## 2011-07-03 NOTE — Assessment & Plan Note (Signed)
F/u by neurology

## 2011-07-05 ENCOUNTER — Encounter: Payer: Self-pay | Admitting: Internal Medicine

## 2011-11-02 ENCOUNTER — Encounter: Payer: Self-pay | Admitting: Internal Medicine

## 2011-11-02 ENCOUNTER — Ambulatory Visit (INDEPENDENT_AMBULATORY_CARE_PROVIDER_SITE_OTHER): Payer: 59 | Admitting: Internal Medicine

## 2011-11-02 VITALS — BP 112/74 | HR 67 | Temp 98.4°F | Wt 158.0 lb

## 2011-11-02 DIAGNOSIS — E785 Hyperlipidemia, unspecified: Secondary | ICD-10-CM

## 2011-11-02 DIAGNOSIS — R51 Headache: Secondary | ICD-10-CM

## 2011-11-02 DIAGNOSIS — D649 Anemia, unspecified: Secondary | ICD-10-CM

## 2011-11-02 LAB — CBC WITH DIFFERENTIAL/PLATELET
Basophils Relative: 0.4 % (ref 0.0–3.0)
Eosinophils Absolute: 0 10*3/uL (ref 0.0–0.7)
Eosinophils Relative: 0.7 % (ref 0.0–5.0)
Hemoglobin: 13.5 g/dL (ref 12.0–15.0)
Lymphocytes Relative: 33 % (ref 12.0–46.0)
MCHC: 33.6 g/dL (ref 30.0–36.0)
MCV: 91.2 fl (ref 78.0–100.0)
Monocytes Absolute: 0.2 10*3/uL (ref 0.1–1.0)
Neutro Abs: 3.3 10*3/uL (ref 1.4–7.7)
RBC: 4.4 Mil/uL (ref 3.87–5.11)
WBC: 5.3 10*3/uL (ref 4.5–10.5)

## 2011-11-02 LAB — ALT: ALT: 27 U/L (ref 0–35)

## 2011-11-02 LAB — LDL CHOLESTEROL, DIRECT: Direct LDL: 73.6 mg/dL

## 2011-11-02 LAB — AST: AST: 22 U/L (ref 0–37)

## 2011-11-02 LAB — FERRITIN: Ferritin: 60.6 ng/mL (ref 10.0–291.0)

## 2011-11-02 NOTE — Assessment & Plan Note (Addendum)
Mild anemia noted, on over-the-counter iron supplementation as recommended by gyn. Plan: Labs

## 2011-11-02 NOTE — Assessment & Plan Note (Signed)
One episode of dizziness, headache and some nausea last week after she was exposed to tobacco. States this was not the worse headache of her life Symptoms resolve, neurological exam normal. Plan: Observation, recommend to call if symptoms resurface

## 2011-11-02 NOTE — Assessment & Plan Note (Signed)
Good compliance with Pravachol, see history of present illness, she still have some arthralgias. Plan: Labs Hold Pravachol for 3 weeks and see if that makes any difference to her symptoms Reassess in 3 months

## 2011-11-02 NOTE — Progress Notes (Signed)
  Subjective:    Patient ID: Beth Blackwell, female    DOB: [birth year], 35 y.o.   MRN: [id number]  HPI ROV: High cholesterol, good compliance with medications. History of anemia, likes her blood recheck, currently taking over-the-counter iron as directed by gynecology. Last week, for few hours become dizzy, had headaches and nausea. According to the patient, symptoms were triggered by smelling tobacco smoke. She is back to normal.   Past Medical History: Hyperlipidemia H/o vit D def   H/o seizures, ~ 2011, f/u locally   Allergies, on shots Dr Beaulah Dinning    Past Surgical History: Hysterectomy   Ovary cyst removed c-section x 2 Appendectomy R wrist surgery Dr Merlyn Lot 06-2010  Family History: Diabetes--mother Hypertension--mother and father   no family history of heart disease   Social History: Original from [place], [place] Married, 2 children Tobacco Use - No.   Alcohol Use - no Regular Exercise - no Drug Use - no   Review of Systems Good compliance with all medicines. Diet is slightly healthier but she has not been able to lose weight. She's not exercising much. Denies any chest pain or shortness of breath Currently taking Pravachol, does report occasional aches and pains in the joints mostly joint movements. No recent seizures activity.    Objective:   Physical Exam General -- alert, well-developed, and overweight appearing. No apparent distress.  Lungs -- normal respiratory effort, no intercostal retractions, no accessory muscle use, and normal breath sounds.   Heart-- normal rate, regular rhythm, no murmur, and no gallop.   Extremities-- no pretibial edema bilaterally  Neurologic-- alert & oriented X3 , EOMI, PERRLA, speech, gait, motor normal. Psych-- Cognition and judgment appear intact. Alert and cooperative with normal attention span and concentration.  not anxious appearing and not depressed appearing.       Assessment & Plan:

## 2011-11-04 ENCOUNTER — Encounter: Payer: Self-pay | Admitting: Internal Medicine

## 2012-03-03 ENCOUNTER — Ambulatory Visit (INDEPENDENT_AMBULATORY_CARE_PROVIDER_SITE_OTHER): Payer: 59 | Admitting: Internal Medicine

## 2012-03-03 ENCOUNTER — Encounter: Payer: Self-pay | Admitting: Internal Medicine

## 2012-03-03 VITALS — BP 139/72 | HR 67 | Temp 98.1°F | Wt 157.0 lb

## 2012-03-03 DIAGNOSIS — Z23 Encounter for immunization: Secondary | ICD-10-CM

## 2012-03-03 DIAGNOSIS — R51 Headache: Secondary | ICD-10-CM

## 2012-03-03 DIAGNOSIS — E785 Hyperlipidemia, unspecified: Secondary | ICD-10-CM

## 2012-03-03 DIAGNOSIS — E559 Vitamin D deficiency, unspecified: Secondary | ICD-10-CM

## 2012-03-03 DIAGNOSIS — D649 Anemia, unspecified: Secondary | ICD-10-CM

## 2012-03-03 LAB — AST: AST: 20 U/L (ref 0–37)

## 2012-03-03 LAB — LIPID PANEL
Cholesterol: 188 mg/dL (ref 0–200)
LDL Cholesterol: 110 mg/dL — ABNORMAL HIGH (ref 0–99)
Triglycerides: 174 mg/dL — ABNORMAL HIGH (ref 0.0–149.0)

## 2012-03-03 NOTE — Assessment & Plan Note (Signed)
Doing great with diet exercise, taking pravastatin without apparent side effects. Labs

## 2012-03-03 NOTE — Assessment & Plan Note (Signed)
Last Hg normal, last iron normal, off iron supplements

## 2012-03-03 NOTE — Assessment & Plan Note (Signed)
On OTCs, labs

## 2012-03-03 NOTE — Assessment & Plan Note (Signed)
asx

## 2012-03-03 NOTE — Progress Notes (Signed)
  Subjective:    Patient ID: Beth Blackwell, female    DOB: Nov 21, 1975, 36 y.o.   MRN: [id number]  HPI Followup from previous visit Doing great with lifestyle, she feels great,has a healthy diet, going to the gym at least one hour 3 times a week. She is back on pravastatin, denies aches and pains.  Past Medical History: Hyperlipidemia H/o vit D def   H/o seizures, ~ 2011, f/u locally   Allergies, on shots Dr Beaulah Dinning    Past Surgical History: Hysterectomy   Ovary cyst removed c-section x 2 Appendectomy R wrist surgery Dr Merlyn Lot 06-2010  Family History: Diabetes--mother Hypertension--mother and father   no family history of heart disease   Social History: Original from [place], [place] Married, 2 children Tobacco Use - No.   Alcohol Use - no Drug Use - no    Review of Systems Good medication compliance Headaches resolved Denies chest pain, shortness of breath, no nausea, vomiting, diarrhea. Rarely uses albuterol.     Objective:   Physical Exam General -- alert, well-developed Lungs -- normal respiratory effort, no intercostal retractions, no accessory muscle use, and normal breath sounds.   Heart-- normal rate, regular rhythm, no murmur, and no gallop.   Extremities-- no pretibial edema bilaterally Psych-- Cognition and judgment appear intact. Alert and cooperative with normal attention span and concentration.  not anxious appearing and not depressed appearing.       Assessment & Plan:

## 2012-03-07 ENCOUNTER — Encounter: Payer: Self-pay | Admitting: *Deleted

## 2012-03-07 LAB — VITAMIN D 1,25 DIHYDROXY
Vitamin D2 1, 25 (OH)2: <8 pg/mL
Vitamin D3 1, 25 (OH)2: 44 pg/mL

## 2012-04-07 ENCOUNTER — Other Ambulatory Visit: Payer: Self-pay | Admitting: *Deleted

## 2012-04-07 ENCOUNTER — Other Ambulatory Visit: Payer: Self-pay | Admitting: Internal Medicine

## 2012-04-07 NOTE — Telephone Encounter (Signed)
Refill done.  

## 2012-04-12 ENCOUNTER — Encounter: Payer: Self-pay | Admitting: Gynecology

## 2012-04-12 ENCOUNTER — Ambulatory Visit (INDEPENDENT_AMBULATORY_CARE_PROVIDER_SITE_OTHER): Payer: 59 | Admitting: Gynecology

## 2012-04-12 VITALS — BP 122/78 | Ht 62.0 in | Wt 156.0 lb

## 2012-04-12 DIAGNOSIS — Z01419 Encounter for gynecological examination (general) (routine) without abnormal findings: Secondary | ICD-10-CM

## 2012-04-12 DIAGNOSIS — Q649 Congenital malformation of urinary system, unspecified: Secondary | ICD-10-CM

## 2012-04-12 NOTE — Patient Instructions (Addendum)
Anemia drepanoctica (Sickle Cell Anemia) La anemia de clulas falciformes requiere una atencin regular por parte del profesional que lo asiste y [place] de parte suya para saber cundo buscar ayuda mdica. El dolor es un problema comn en nios con enfermedades de las clulas falciformes. Generalmente el comienzo se produce en bebs menores de 1 ao. El Software engineer en cualquier parte del cuerpo, pero normalmente aparece en las extremidades, la espalda, el pecho o el vientre (abdomen). Los episodios de dolor pueden comenzar sbitamente o seguir a Pharmacologist. Estos ataques pueden notarse como una disminucin de la [place], prdida del apetito, cambio en el comportamiento, o simplemente quejas de Engineer, mining. DIAGNSTICO  Pruebas especializadas de sangre y genticas pueden ayudar a establecer un diagnstico en las primeras etapas de la enfermedad. Pueden realizarse pruebas de sangre para [employer] niveles sanguneos.  Se realizan escaneos especializados del cerebro cuando hay problemas en el cerebro durante las crisis.  Posteriormente, durante el curso de la enfermedad, pueden realizarse pruebas para [employer]. INSTRUCCIONES PARA EL CUIDADO DOMICILIARIO  Mantenga una buena hidratacin. Aumente la ingesta de lquidos de su hijo en poca de calor y durante el ejercicio.  Evite fumar cerca del nio. El fumar disminuye el oxgeno de la sangre y puede causar la crisis.  Chief of Staff. Slo adminstrele medicamentos de venta libre o los que le prescriba el mdico para Engineer, materials, el malestar o la fiebre, segn las indicaciones. No le administre aspirina a su nio porque existe el riesgo de contraer el [place].  Realice controles de salud regulares para mantener un nivel de glbulos rojos (hemoglobina) adecuado. Un nivel de anemia moderado protege contra la crisis drepanoctica.  Usted y su hijo deben recibir las mismas vacunas y cuidados que las [employer].  Las Museum/gallery curator a sus bebes siempre que les sea posible. Si no es Proofreader, use frmulas para el bibern con agregado de hierro. No debe suministrarse hierro adicional, a menos que falte. Las personas con enfermedad de las clulas falciformes acumulan hierro ms rpido de lo normal. Utilice cido flico y vitaminas adicionales como le sea indicado.  Si a usted o a su hijo les prescriben antibiticos u otros medicamentos preventivos, tmelos segn lo prescripto.  Existen campamentos de verano para los nios que padecen esta enfermedad. Estos pueden ayudar a los jvenes a enfrentarse con su problema. Los campamentos los renen con nios que tienen la misma enfermedad.  Los jvenes que sufren este trastorno pueden sentirse frustrados o furiosos por su enfermedad. Puede causar rebelin y Dispensing optician a seguir el tratamiento mdico. Los grupos de [place] o de asesoramiento pueden Paramedic este problema.  Asegrese de [employer] use un brazalete de Control and instrumentation engineer mdico. Cuando viaje, lleve en todo momento el informe mdico, el nombre del los profesionales que asisten al nio y los medicamentos que toma. SOLICITE ATENCIN MDICA DE INMEDIATO SI:  El nio se siente mareado o sufre un desmayo.  Su hijo comienza a sentir nuevamente dolor abdominal, especialmente en la zona izquierda, cerca del estmago.  El nio sufre rigidez del pene persistente y a menudo incmoda y dolorosa. Esto se denomina priapismo. Siempre cuide de [employer]. A menudo es embarazoso para ellos y podran no llamar su atencin sobre esto. Esta es una emergencia mdica que requiere inmediato tratamiento. Si no se realiza un tratamiento, esta enfermedad conducir a la impotencia.  El nio siente adormecimiento o le resulta difcil mover sus brazos y  piernas.  Al nio le resulta difcil hablar.  Tiene fiebre.  Su hijo presentan sntomas de infeccin (escalofros, aletargamiento, irritabilidad, mala  alimentacin, vmitos). Cuanto menor sea el nio, ms deber preocuparse.  Cuando presente fiebre, no le administre medicamentos para bajarla enseguida. Puede ocultar un problema en desarrollo. Notifquelo inmediatamente al profesional que lo asiste.  Su hijo presenta dolor que no disminuye con los medicamentos.  El nio tiene dificultad para respirar o con la tos elimina esputos similares al pus.  El nio presenta algn problema nuevo y que lo preocupe. Document Released: 08/07/2008 Document Revised: 08/03/2011 [facility] Patient Information 2013 [place], [place].

## 2012-04-12 NOTE — Progress Notes (Signed)
Beth Blackwell 1976-01-29 [id number]   History:    36 y.o.  for annual gyn exam with no complaints today but a few months ago she thought she felt a small tender area on the left breast which comes and goes on some months. Review of her record indicated that ultrasound and mammogram done and only fibroglandular tissue was noted and recommended followup screening mammogram at age 53. Patient does have a history of hyperlipidemia and is being followed by her primary physician Dr. Drue Novel who is treating her with Pravachol 20 mg daily. Patient had recent lab work done to include lipid profile, liver function test, vitamin D, CBC and iron levels which were all normal. Patient does have a history of sickle cell trait. Patient with prior history of total abdominal hysterectomy in 2007. She's been followed by [facility] neurology for her seizure disorder which she sees every 6 months. She takes Neurontin 600 mg daily and is also on Keppra 500 mg daily as well. She takes Proventil inhaler when necessary for her asthma. Patient's last mammogram was in 2012 was normal only fatty tissue noted in the area of concern of the left breast as described above. She frequently does her self breast examination. Patient received flu vaccine recently.  Past medical history,surgical history, family history and social history were all reviewed and documented in the EPIC chart.  Gynecologic History No LMP recorded. Patient has had a hysterectomy. Contraception: status post hysterectomy Last Pap: 2012. Results were: normal Last mammogram: 2012. Results were: normal  Obstetric History OB History    Grav Para Term Preterm Abortions TAB SAB Ect Mult Living   2 2             # Outc Date GA Lbr Len/2nd Wgt Sex Del Anes PTL Lv   1 PAR      LTCS      2 PAR      LTCS          ROS: A ROS was performed and pertinent positives and negatives are included in the history.  GENERAL: No fevers or chills. HEENT: No change in vision, no  earache, sore throat or sinus congestion. NECK: No pain or stiffness. CARDIOVASCULAR: No chest pain or pressure. No palpitations. PULMONARY: No shortness of breath, cough or wheeze. GASTROINTESTINAL: No abdominal pain, nausea, vomiting or diarrhea, melena or bright red blood per rectum. GENITOURINARY: No urinary frequency, urgency, hesitancy or dysuria. MUSCULOSKELETAL: No joint or muscle pain, no back pain, no recent trauma. DERMATOLOGIC: No rash, no itching, no lesions. ENDOCRINE: No polyuria, polydipsia, no heat or cold intolerance. No recent change in weight. HEMATOLOGICAL: No anemia or easy bruising or bleeding. NEUROLOGIC: No headache, seizures, numbness, tingling or weakness. PSYCHIATRIC: No depression, no loss of interest in normal activity or change in sleep pattern.     Exam: chaperone present  BP 122/78  Ht 5\' 2"  (1.575 m)  Wt 156 lb (70.761 kg)  BMI 28.53 kg/m2  Body mass index is 28.53 kg/(m^2).  General appearance : Well developed well nourished female. No acute distress HEENT: Neck supple, trachea midline, no carotid bruits, no thyroidmegaly Lungs: Clear to auscultation, no rhonchi or wheezes, or rib retractions  Heart: Regular rate and rhythm, no murmurs or gallops Breast:Examined in sitting and supine position were symmetrical in appearance, no palpable masses or tenderness,  no skin retraction, no nipple inversion, no nipple discharge, no skin discoloration, no axillary or supraclavicular lymphadenopathy Abdomen: no palpable masses or tenderness, no rebound or guarding  Extremities: no edema or skin discoloration or tenderness  Pelvic:  Bartholin, Urethra, Skene Glands: Within normal limits             Vagina: No gross lesions or discharge  Cervix absent  Uterus absent  Adnexa  Without masses or tenderness  Anus and perineum  normal   Rectovaginal  normal sphincter tone without palpated masses or tenderness             Hemoccult not indicated     Assessment/Plan:  36  y.o. female for annual exam with no abnormalities detected. Patient was encouraged to do her monthly self breast examination. Patient was reminded to continue to take her calcium vitamin D for osteoporosis prevention and regular exercise. It appears this is Dr. Drue Novel his been treating her for hyperlipidemia her cholesterol numbers have improved tremendously. She was encouraged to continue her diet. Literature information sickle cell disease was provided in Spanish also. No Pap smear done today. New screening guidelines discussed. Patient would know prior history of abnormal Pap smears.    Ok Edwards MD, 10:52 AM 04/12/2012

## 2012-04-22 ENCOUNTER — Encounter: Payer: Self-pay | Admitting: Gynecology

## 2012-06-21 ENCOUNTER — Ambulatory Visit (INDEPENDENT_AMBULATORY_CARE_PROVIDER_SITE_OTHER): Payer: 59 | Admitting: Gynecology

## 2012-06-21 ENCOUNTER — Encounter: Payer: Self-pay | Admitting: Gynecology

## 2012-06-21 VITALS — BP 130/82

## 2012-06-21 DIAGNOSIS — N898 Other specified noninflammatory disorders of vagina: Secondary | ICD-10-CM

## 2012-06-21 DIAGNOSIS — IMO0001 Reserved for inherently not codable concepts without codable children: Secondary | ICD-10-CM

## 2012-06-21 DIAGNOSIS — B373 Candidiasis of vulva and vagina: Secondary | ICD-10-CM

## 2012-06-21 DIAGNOSIS — R350 Frequency of micturition: Secondary | ICD-10-CM

## 2012-06-21 LAB — URINALYSIS W MICROSCOPIC + REFLEX CULTURE
Glucose, UA: NEGATIVE mg/dL
Hgb urine dipstick: NEGATIVE
Ketones, ur: NEGATIVE mg/dL
Leukocytes, UA: NEGATIVE
Nitrite: NEGATIVE
Protein, ur: NEGATIVE mg/dL
pH: 7.5 (ref 5.0–8.0)

## 2012-06-21 LAB — WET PREP FOR TRICH, YEAST, CLUE: Clue Cells Wet Prep HPF POC: NONE SEEN

## 2012-06-21 MED ORDER — FLUCONAZOLE 100 MG PO TABS: 100.0000 mg | ORAL_TABLET | Freq: Every day | ORAL | Status: DC

## 2012-06-21 NOTE — Patient Instructions (Addendum)
Vulvovaginitis Candidisica (Candidal Vulvovaginitis) La vulvovaginitis candidisica es una infeccin de la vagina y la vulva. La vulva es la piel que rodea la abertura de la vagina. Puede causar picazn y molestias dentro de y alrededor de la vagina.  CUIDADOS EN EL HOGAR  Slo tome medicamentos como lo indique su mdico.  No mantenga relaciones sexuales hasta que la infeccin haya curado o segn le indique el mdico.  Practique sexo seguro.  Informe a su compaero sexual acerca de su infeccin.  No tome duchas vaginales ni use tampones.  Use ropa interior de algodn. No utilice pantalones ni pantimedias ajustados.  Coma yogur. Esto puede ayudar a tratar y prevenir las infecciones por cndida. SOLICITE AYUDA DE INMEDIATO SI:   Tiene fiebre.  Los problemas empeoran durante el tratamiento, o si no mejora luego de 3 das.  Tiene malestar, irritacin, o picazn en la zona de la vagina o la vulva.  Siente dolor en al mantener relaciones sexuales.  Comienza a sentir dolor abdominal. ASEGRESE DE QUE:  Comprende estas instrucciones.  Controlar su enfermedad.  Solicitar ayuda de inmediato si no mejora o empeora. Document Released: 06/13/2010 Document Revised: 08/03/2011 Exit[employer] Patient Information 2013 [employer].   

## 2012-06-22 NOTE — Progress Notes (Signed)
Patient presented to the office today that stating that for the past few days she was complaining of urinary frequency. She felt at times that she was not completely emptying her bladder. She was also having slight vaginal discharge she thought it was yellow some odor but no true pruritus. She denied any back pain fever chills nausea or vomiting.  Exam: Bartholin urethra Skene was within normal limits Vagina: White slight discharge no true odor Cervix: No lesions or discharge Bimanual exam not done Rectal exam not done  Wet prep moderate yeast was noted  Assessment/plan: Symptoms suspicious for urinary tract infection although the urinalysis today was negative. We will await the results of the urine culture. For her moniliasis she will be placed on Diflucan 100 mg by mouth.

## 2012-07-06 ENCOUNTER — Encounter: Payer: Self-pay | Admitting: Gynecology

## 2012-07-06 ENCOUNTER — Ambulatory Visit (INDEPENDENT_AMBULATORY_CARE_PROVIDER_SITE_OTHER): Payer: 59 | Admitting: Gynecology

## 2012-07-06 VITALS — BP 118/78

## 2012-07-06 DIAGNOSIS — R350 Frequency of micturition: Secondary | ICD-10-CM

## 2012-07-06 DIAGNOSIS — R631 Polydipsia: Secondary | ICD-10-CM

## 2012-07-06 DIAGNOSIS — N318 Other neuromuscular dysfunction of bladder: Secondary | ICD-10-CM

## 2012-07-06 DIAGNOSIS — N3281 Overactive bladder: Secondary | ICD-10-CM

## 2012-07-06 DIAGNOSIS — R632 Polyphagia: Secondary | ICD-10-CM

## 2012-07-06 DIAGNOSIS — R351 Nocturia: Secondary | ICD-10-CM

## 2012-07-06 DIAGNOSIS — N3941 Urge incontinence: Secondary | ICD-10-CM

## 2012-07-06 LAB — URINALYSIS W MICROSCOPIC + REFLEX CULTURE
Nitrite: NEGATIVE
Specific Gravity, Urine: 1.015 (ref 1.005–1.030)
Urobilinogen, UA: 0.2 mg/dL (ref 0.0–1.0)
pH: 7 (ref 5.0–8.0)

## 2012-07-06 LAB — HEMOGLOBIN A1C
Hgb A1c MFr Bld: 5.5 % (ref <5.7)
Mean Plasma Glucose: 111 mg/dL (ref <117)

## 2012-07-06 MED ORDER — FESOTERODINE FUMARATE ER 4 MG PO TB24: 4.0000 mg | ORAL_TABLET | Freq: Every day | ORAL | Status: DC

## 2012-07-06 NOTE — Progress Notes (Signed)
Patient presented to the office today c/o of urinary frequency as well as urgency incintinence and getting up several times at nigt to urinate. She has mentioned to me also that she has craving for sweet and thirsty a lot of times. She denies dysuria, fever or back pains. She denies any vaginal discharge.Patient with prior hysterectomy.  Exam: BUS: WNL Vagina no cystocele or rectocele cuff intact Bimanual exam: no masses and no tenderness Rectal: Not examined   Q-Tip angle test < 30 degree change. No evidence of urethral hypermobility  Patient was also examined in the erect position and on valsalva she did not leak.  A/P: Will need to r/o diabetes (symptoms noted above as well as family history). Will obtain a HgBA1C. 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 We are going to start her on an anticholinergic Toviaz 4 mg daily and reassess in 3 months. Risk and benefits of the medication discussed. Literature in Spanish was provided.Urinalysis was negative today.

## 2012-07-06 NOTE — Patient Instructions (Addendum)
Overactive Bladder, Adult The bladder has two functions that are totally opposite of the other. One is to relax and stretch out so it can store urine (fills like a balloon), and the other is to contract and squeeze down so that it can empty the urine that it has stored. Proper functioning of the bladder is a complex mixing of these two functions. The filling and emptying of the bladder can be influenced by:  The bladder.  The spinal cord.  The brain.  The nerves going to the bladder.  Other organs that are closely related to the bladder such as prostate in males and the vagina in females. As your bladder fills with urine, nerve signals are sent from the bladder to the brain to tell you that you may need to urinate. Normal urination requires that the bladder squeeze down with sufficient strength to empty the bladder, but this also requires that the bladder squeeze down sufficiently long to finish the job. In addition the sphincter muscles, which normally keep you from leaking urine, must also relax so that the urine can pass. Coordination between the bladder muscle squeezing down and the sphincter muscles relaxing is required to make everything happen normally. With an overactive bladder sometimes the muscles of the bladder contract unexpectedly and involuntarily and this causes an urgent need to urinate. The normal response is to try to hold urine in by contracting the sphincter muscles. Sometimes the bladder contracts so strongly that the sphincter muscles cannot stop the urine from passing out and incontinence occurs. This kind of incontinence is called urge incontinence. Having an overactive bladder can be embarrassing and awkward. It can keep you from living life the way you want to. Many people think it is just something you have to put up with as you grow older or have certain health conditions. In fact, there are treatments that can help make your life easier and more pleasant. CAUSES  Many  things can cause an overactive bladder. Possibilities include:  Urinary tract infection or infection of nearby tissues such as the prostate.  Prostate enlargement.  In women, multiple pregnancies or surgery on the uterus or urethra.  Bladder stones, inflammation or tumors.  Caffeine.  Alcohol.  Medications. For example, diuretics (drugs that help the body get rid of extra fluid) increase urine production. Some other medicines must be taken with lots of fluids.  Muscle or nerve weakness. This might be the result of a spinal cord injury, a stroke, multiple sclerosis or Parkinson's disease.  Diabetes can cause a high urine volume which fills the bladder so quickly that the normal urge to urinate is triggered very strongly. SYMPTOMS   Loss of bladder control. You feel the need to urinate and cannot make your body wait.  Sudden, strong urges to urinate.  Urinating 8 or more times a day.  Waking up to urinate two or more times a night. DIAGNOSIS  To decide if you have overactive bladder, your healthcare provider will probably:  Ask about symptoms you have noticed.  Ask about your overall health. This will include questions about any medications you are taking.  Do a physical examination. This will help determine if there are obvious blockages or other problems.  Order some tests. These might include:  A blood test to check for diabetes or other health issues that could be contributing to the problem.  Urine testing. This could measure the flow of urine and the pressure on the bladder.  A test of your neurological   system (the brain, spinal cord and nerves). This is the system that senses the need to urinate. Some of these tests are called flow tests, bladder pressure tests and electrical measurements of the sphincter muscle.  A bladder test to check whether it is emptying completely when you urinate.  Cytoscopy. This test uses a thin tube with a tiny camera on it. It offers a  look inside your urethra and bladder to see if there are problems.  Imaging tests. You might be given a contrast dye and then asked to urinate. X-rays are taken to see how your bladder is working. TREATMENT  An overactive bladder can be treated in many ways. The treatment will depend on the cause. Whether you have a mild or severe case also makes a difference. Often, treatment can be given in your healthcare provider's office or clinic. Be sure to discuss the different options with your caregiver. They include:  Behavioral treatments. These do not involve medication or surgery:  Bladder training. For this, you would follow a schedule to urinate at regular intervals. This helps you learn to control the urge to urinate. At first, you might be asked to wait a few minutes after feeling the urge. In time, you should be able to schedule bathroom visits an hour or more apart.  Kegel exercises. These exercises strengthen the pelvic floor muscles, which support the bladder. By toning these muscles, they can help control urination, even if the bladder muscles are overactive. A specialist will teach you how to do these exercises correctly. They will require daily practice.  Weight loss. If you are obese or overweight, losing weight might stop your bladder from being overactive. Talk to your healthcare provider about how many pounds you should lose. Also ask if there is a specific program or method that would work best for you.  Diet change. This might be suggested if constipation is making your overactive bladder worse. Your healthcare provider or a nutritionist can explain ways to change what you eat to ease constipation. Other people might need to take in less caffeine or alcohol. Sometimes drinking fewer fluids is needed, too.  Protection. This is not an actual treatment. But, you could wear special pads to take care of any leakage while you wait for other treatments to take effect. This will help you avoid  embarrassment.  Physical treatments.  Electrical stimulation. Electrodes will send gentle pulses to the nerves or muscles that help control the bladder. The goal is to strengthen them. Sometimes this is done with the electrodes outside of the body. Or, they might be placed inside the body (implanted). This treatment can take several months to have an effect.  Medications. These are usually used along with other treatments. Several medicines are available. Some are injected into the muscles involved in urination. Others come in pill form. Medications sometimes prescribed include:  Anticholinergics. These drugs block the signals that the nerves deliver to the bladder. This keeps it from releasing urine at the wrong time. Researchers think the drugs might help in other ways, too.  Imipramine. This is an antidepressant. But, it relaxes bladder muscles.  Botox. This is still experimental. Some people believe that injecting it into the bladder muscles will relax them so they work more normally. It has also been injected into the sphincter muscle when the sphincter muscle does not open properly. This is a temporary fix, however. Also, it might make matters worse, especially in older people.  Surgery.  A device might be implanted   to help manage your nerves. It works on the nerves that signal when you need to urinate.  Surgery is sometimes needed with electrical stimulation. If the electrodes are implanted, this is done through surgery.  Sometimes repairs need to be made through surgery. For example, the size of the bladder can be changed. This is usually done in severe cases only. HOME CARE INSTRUCTIONS   Take any medications your healthcare provider prescribed or suggested. Follow the directions carefully.  Practice any lifestyle changes that are recommended. These might include:  Drinking less fluid or drinking at different times of the day. If you need to urinate often during the night, for  example, you may need to stop drinking fluids early in the evening.  Cutting down on caffeine or alcohol. They can both make an overactive bladder worse. Caffeine is found in coffee, tea and sodas.  Doing Kegel exercises to strengthen muscles.  Losing weight, if that is recommended.  Eating a healthy and balanced diet. This will help you avoid constipation.  Keep a journal or a log. You might be asked to record how much you drink and when, and also when you feel the need to urinate.  Learn how to care for implants or other devices, such as pessaries. SEEK MEDICAL CARE IF:   Your overactive bladder gets worse.  You feel increased pain or irritation when you urinate.  You notice blood in your urine.  You have questions about any medications or devices that your healthcare provider recommended.  You notice blood, pus or swelling at the site of any test or treatment procedure.  You have an oral temperature above 102 F (38.9 C). SEEK IMMEDIATE MEDICAL CARE IF:  You have an oral temperature above 102 F (38.9 C), not controlled by medicine. Document Released: 03/07/2009 Document Revised: 08/03/2011 Document Reviewed: 03/07/2009 Exit[employer] Patient Information 2013 [employer].  

## 2012-11-29 ENCOUNTER — Other Ambulatory Visit: Payer: Self-pay | Admitting: Internal Medicine

## 2012-11-29 NOTE — Telephone Encounter (Signed)
Refill done.  

## 2013-01-09 ENCOUNTER — Ambulatory Visit (INDEPENDENT_AMBULATORY_CARE_PROVIDER_SITE_OTHER): Payer: BC Managed Care – PPO | Admitting: Nurse Practitioner

## 2013-01-09 ENCOUNTER — Encounter: Payer: Self-pay | Admitting: Nurse Practitioner

## 2013-01-09 VITALS — BP 104/71 | HR 65 | Ht 63.0 in | Wt 159.0 lb

## 2013-01-09 DIAGNOSIS — G40209 Localization-related (focal) (partial) symptomatic epilepsy and epileptic syndromes with complex partial seizures, not intractable, without status epilepticus: Secondary | ICD-10-CM | POA: Insufficient documentation

## 2013-01-09 MED ORDER — GABAPENTIN 600 MG PO TABS: 600.0000 mg | ORAL_TABLET | Freq: Three times a day (TID) | ORAL | Status: DC

## 2013-01-09 MED ORDER — LEVETIRACETAM 500 MG PO TABS: 1000.0000 mg | ORAL_TABLET | Freq: Two times a day (BID) | ORAL | Status: DC

## 2013-01-09 NOTE — Progress Notes (Signed)
Reason for visit follow up for seizure   HPI:   Ms Beth Blackwell 15, -year-old returns for followup. She was initially evaluated for seizure by Dr. Thad Ranger on 11/01/09 and last seen 12/28/11.    She had frequent episodes of R hand involuntary clenching and pain, occurs multiple times a day for at least 71yrs, lasts <8min and resolves- had neg EMG for it here in past- 2 episodes of "sz" with LOC, tongue trauma November 2010 and April 2011 occuring out of sleep, starting w/ above, then progress to LOC, R arm up, eyes rolled back, foaming at mouth, 3-4 minutes, confused after- no involve face or leg- arm sx >3y, getting more frequent- on gabapentin about 46yr, initially seemed to help, but less now- no FH sz- no clear precipitating factors for spells (activity, time of day)- persistent pain volar medial wrist, seen at [facility]- with recent steroid injection. She was placed on Keppra, currently at 2000mg  daily. Pt says it has helped a lot, denies jerking of the hand and arm. EEG was abnormal in left temporal area, questionable underlying pathology. She had a MRI of the brain which was normal. TODAY: 12/1812: No seizure activity since last seen.   No new complaints. Needs refills. Labs are drawn yearly by Dr. Drue Novel.    ROS:  Negative   Medications Current Outpatient Prescriptions on File Prior to Visit  Medication Sig Dispense Refill  . Ascorbic Acid (VITAMIN C) 1000 MG tablet Take 1,000 mg by mouth daily.      . Calcium Carbonate-Vitamin D (CALTRATE 600+D) 600-400 MG-UNIT per tablet Take 1 tablet by mouth daily.        . cetirizine (ZYRTEC) 10 MG tablet Take 10 mg by mouth daily.      Marland Kitchen gabapentin (NEURONTIN) 600 MG tablet Take 600 mg by mouth 3 (three) times daily.        Marland Kitchen levETIRAcetam (KEPPRA) 500 MG tablet Take 1,000 mg by mouth every 12 (twelve) hours. 1 tablet every morning and 2 tabs qhs       . OMEGA 3 1000 MG CAPS Take by mouth.        . pravastatin (PRAVACHOL) 20 MG tablet TAKE 1 TABLET BY MOUTH  DAILY.  30 tablet  0   No current facility-administered medications on file prior to visit.    Allergies  Allergies  Allergen Reactions  . Acetaminophen     Physical Exam General: well developed, well nourished, seated, in no evident distress Head: head normocephalic and atraumatic. Oropharynx benign Neck: supple with no carotid  bruits Cardiovascular: regular rate and rhythm, no murmurs  Neurologic Exam Mental Status: Awake and fully alert. Oriented to place and time. Follows all commands. Speech and language normal.   Cranial Nerves: Fundoscopic exam reveals sharp disc margins. Pupils equal, briskly reactive to light. Extraocular movements full without nystagmus. Visual fields full to confrontation. Hearing intact and symmetric to finger snap.  Face, tongue, palate move normally and symmetrically. Neck flexion and extension normal.  Motor: Normal bulk and tone. Normal strength in all tested extremity muscles.No focal weakness Sensory.: intact to touch and pinprick and vibratory.  Coordination: Rapid alternating movements normal in all extremities. Finger-to-nose and heel-to-shin performed accurately bilaterally. Gait and Station: Arises from chair without difficulty. Stance is normal. Gait demonstrates normal stride length and balance . Able to heel, toe and tandem walk without difficulty.  Reflexes: 2+ and symmetric. Toes downgoing.     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 Drue Novel Follow up yearly and when necessary  Nilda Riggs, GNP-BC APRN

## 2013-01-09 NOTE — Progress Notes (Signed)
I have read the note, and I agree with the clinical assessment and plan.  WILLIS,CHARLES KEITH   

## 2013-01-09 NOTE — Patient Instructions (Addendum)
Continue Keppra and gabapentin at current doses will renew both prescriptions Labs are followed by Dr. Drue Novel Follow up yearly and when necessary

## 2013-01-25 ENCOUNTER — Other Ambulatory Visit: Payer: Self-pay | Admitting: Internal Medicine

## 2013-01-25 NOTE — Telephone Encounter (Signed)
Med filled pt has a follow up with [facility] on 9/29.

## 2013-02-20 ENCOUNTER — Encounter: Payer: Self-pay | Admitting: Internal Medicine

## 2013-02-20 ENCOUNTER — Ambulatory Visit (INDEPENDENT_AMBULATORY_CARE_PROVIDER_SITE_OTHER): Payer: BC Managed Care – PPO | Admitting: Internal Medicine

## 2013-02-20 VITALS — BP 106/70 | HR 71 | Temp 98.6°F | Wt 160.0 lb

## 2013-02-20 DIAGNOSIS — Z23 Encounter for immunization: Secondary | ICD-10-CM

## 2013-02-20 DIAGNOSIS — E785 Hyperlipidemia, unspecified: Secondary | ICD-10-CM

## 2013-02-20 MED ORDER — PRAVASTATIN SODIUM 20 MG PO TABS: ORAL_TABLET | ORAL | Status: DC

## 2013-02-20 NOTE — Patient Instructions (Signed)
Please come back fasting: CMP, FLP --- dx hyperlipidemia

## 2013-02-20 NOTE — Assessment & Plan Note (Signed)
Good compliance with Pravachol, reports the need for improvement of her lifestyle, counseled about diet and exercise. Labs

## 2013-02-20 NOTE — Progress Notes (Signed)
  Subjective:    Patient ID: Beth Blackwell, female    DOB: 1975-12-22, 37 y.o.   MRN: [id number]  HPI Routine visit, here for evolution the hyperlipidemia. On Pravachol, reports good compliance, no apparent side effects.   Past Medical History  Diagnosis Date  . Hyperlipidemia, mixed   . Vitamin D deficiency   . Chronic headaches   . Seizures   . H/O: hysterectomy   . Depression   . Sickle cell trait    Past Surgical History  Procedure Laterality Date  . Ovarian cyst surgery    . Appendectomy    . Cesarean section      X2  . Hand surgery  2012    RIGHT WRIST/ FOREARM  . Tubal ligation      HULKA CLIP TECHNIQUE  . Abdominal hysterectomy  12/28/2005    TAH/ SEPTATE UTERUS    Family History: Diabetes--mother Hypertension--mother and father   no family history of heart disease   Social History: Original from [place], [place] Married, 2 children Tobacco Use - No.   Alcohol Use - no Drug Use - no    Review of Systems Diet, needs improvement Exercise not very active lately but plans to increase her exercise in the next few weeks. Denies myalgias or arthralgias except for occasional knee pain without swelling.      Objective:   Physical Exam BP 106/70  Pulse 71  Temp(Src) 98.6 F (37 C) (Oral)  Wt 160 lb (72.576 kg)  BMI 28.35 kg/m2  SpO2 98% General -- alert, well-developed, NAD.   Lungs -- normal respiratory effort, no intercostal retractions, no accessory muscle use, and normal breath sounds.  Heart-- normal rate, regular rhythm, no murmur.   Extremities-- no pretibial edema bilaterally . Knees normal to inspection-palpation Neurologic--  alert & oriented X3. Speech normal, gait normal, strength normal in all extremities.   Psych-- Cognition and judgment appear intact. Cooperative with normal attention span and concentration. No anxious appearing , no depressed appearing.     Assessment & Plan:

## 2013-02-21 ENCOUNTER — Other Ambulatory Visit (INDEPENDENT_AMBULATORY_CARE_PROVIDER_SITE_OTHER): Payer: BC Managed Care – PPO

## 2013-02-21 DIAGNOSIS — E785 Hyperlipidemia, unspecified: Secondary | ICD-10-CM

## 2013-02-21 LAB — COMPREHENSIVE METABOLIC PANEL
ALT: 24 U/L (ref 0–35)
AST: 19 U/L (ref 0–37)
CO2: 29 mEq/L (ref 19–32)
Creatinine, Ser: 0.5 mg/dL (ref 0.4–1.2)
GFR: 137.64 mL/min (ref >60.00)
Total Bilirubin: 0.8 mg/dL (ref 0.3–1.2)

## 2013-02-21 LAB — LIPID PANEL
HDL: 35.2 mg/dL — ABNORMAL LOW (ref >39.00)
VLDL: 46.6 mg/dL — ABNORMAL HIGH (ref 0.0–40.0)

## 2013-02-23 ENCOUNTER — Encounter: Payer: Self-pay | Admitting: *Deleted

## 2013-03-22 ENCOUNTER — Other Ambulatory Visit: Payer: Self-pay | Admitting: Internal Medicine

## 2013-03-22 NOTE — Telephone Encounter (Signed)
rx refilled per protocol. DJR  

## 2013-04-17 ENCOUNTER — Encounter: Payer: BC Managed Care – PPO | Admitting: Gynecology

## 2013-05-04 ENCOUNTER — Encounter: Payer: Self-pay | Admitting: Gynecology

## 2013-05-04 ENCOUNTER — Ambulatory Visit (INDEPENDENT_AMBULATORY_CARE_PROVIDER_SITE_OTHER): Payer: BC Managed Care – PPO | Admitting: Gynecology

## 2013-05-04 VITALS — BP 114/78 | Ht 62.0 in | Wt 156.0 lb

## 2013-05-04 DIAGNOSIS — N318 Other neuromuscular dysfunction of bladder: Secondary | ICD-10-CM

## 2013-05-04 DIAGNOSIS — R351 Nocturia: Secondary | ICD-10-CM

## 2013-05-04 DIAGNOSIS — R350 Frequency of micturition: Secondary | ICD-10-CM

## 2013-05-04 DIAGNOSIS — Z01419 Encounter for gynecological examination (general) (routine) without abnormal findings: Secondary | ICD-10-CM

## 2013-05-04 DIAGNOSIS — N3281 Overactive bladder: Secondary | ICD-10-CM

## 2013-05-04 MED ORDER — OXYBUTYNIN CHLORIDE ER 10 MG PO TB24: 10.0000 mg | ORAL_TABLET | Freq: Every day | ORAL | Status: DC

## 2013-05-04 NOTE — Progress Notes (Signed)
Beth Blackwell [birth year] [id number]   History:    37 y.o.  for annual gyn exam whose only complaint was urinary frequency and nocturia. She had the symptoms early part of this year and was placed on an anticholinergic called Toviaz 4 mg daily but she took it for less than one month because of cost. She was getting good response. She denies any urinary incontinence. All her labs were drawn by her PCP.Patient with prior history of total abdominal hysterectomy in 2007. She's been followed by [facility] neurology for her seizure disorder which she sees every 6 months. She takes Neurontin 600 mg daily and is also on Keppra 500 mg daily as well. She takes Proventil inhaler when necessary for her asthma. Patient's last mammogram was in 2012 was normal only fatty tissue noted in the area of concern of the left breast as described above. She frequently does her self breast examination. Patient's vaccines are up-to-date. Patient with no prior history of abnormal Pap smears.    Past medical history,surgical history, family history and social history were all reviewed and documented in the EPIC chart.  Gynecologic History No LMP recorded. Patient has had a hysterectomy. Contraception: status post hysterectomy Last Pap: 2012. Results were: normal Last mammogram: 2012. Results were: normal  Obstetric History OB History  Gravida Para Term Preterm AB SAB TAB Ectopic Multiple Living  2 2            # Outcome Date GA Lbr Len/2nd Weight Sex Delivery Anes PTL Lv  2 PAR      LTCS     1 PAR      LTCS          ROS: A ROS was performed and pertinent positives and negatives are included in the history.  GENERAL: No fevers or chills. HEENT: No change in vision, no earache, sore throat or sinus congestion. NECK: No pain or stiffness. CARDIOVASCULAR: No chest pain or pressure. No palpitations. PULMONARY: No shortness of breath, cough or wheeze. GASTROINTESTINAL: No abdominal pain, nausea, vomiting or diarrhea,  melena or bright red blood per rectum. GENITOURINARY: No urinary frequency, urgency, hesitancy or dysuria. MUSCULOSKELETAL: No joint or muscle pain, no back pain, no recent trauma. DERMATOLOGIC: No rash, no itching, no lesions. ENDOCRINE: No polyuria, polydipsia, no heat or cold intolerance. No recent change in weight. HEMATOLOGICAL: No anemia or easy bruising or bleeding. NEUROLOGIC: No headache, seizures, numbness, tingling or weakness. PSYCHIATRIC: No depression, no loss of interest in normal activity or change in sleep pattern.     Exam: chaperone present  BP 114/78  Ht 5\' 2"  (1.575 m)  Wt 156 lb (70.761 kg)  BMI 28.53 kg/m2  Body mass index is 28.53 kg/(m^2).  General appearance : Well developed well nourished female. No acute distress HEENT: Neck supple, trachea midline, no carotid bruits, no thyroidmegaly Lungs: Clear to auscultation, no rhonchi or wheezes, or rib retractions  Heart: Regular rate and rhythm, no murmurs or gallops Breast:Examined in sitting and supine position were symmetrical in appearance, no palpable masses or tenderness,  no skin retraction, no nipple inversion, no nipple discharge, no skin discoloration, no axillary or supraclavicular lymphadenopathy Abdomen: no palpable masses or tenderness, no rebound or guarding Extremities: no edema or skin discoloration or tenderness  Pelvic:  Bartholin, Urethra, Skene Glands: Within normal limits             Vagina: No gross lesions or discharge  Cervix: absence  Uterus  absent  Adnexa  Without masses or tenderness  Anus and perineum  normal   Rectovaginal  normal sphincter tone without palpated masses or tenderness             Hemoccult not indicated     Assessment/Plan:  37 y.o. female for annual exam with clinical evidence of overactive bladder but no stress urinary incontinence. The patient responded to Gala Murdoch early this year but had to discontinue it because of cost. Patient will be started on  Oxybutynin extended release 10 mg daily. She was encouraged to do her monthly breast exam. We discussed importance of calcium vitamin D for osteoporosis prevention. She will continue to followup with her PCP for hyperlipidemia. Pap smear was not done today the course of the new guidelines.  Note: This dictation was prepared with  Dragon/digital dictation along withSmart phrase technology. Any transcriptional errors that result from this process are unintentional.   Ok Edwards MD, 2:06 PM 05/04/2013

## 2013-05-04 NOTE — Patient Instructions (Signed)
Oxybutynin extended-release tablets Qu es este medicamento? La OXIBUTININA se utiliza para tratar la vejiga hiperactiva. Este medicamento reduce la necesidad de Geographical information systems officer con frecuencia. Tambin puede ayudar a evitar que una persona se orine accidentalmente. Este medicamento puede ser utilizado para otros usos; si tiene alguna pregunta consulte con su proveedor de atencin mdica o con su farmacutico. MARCAS COMERCIALES DISPONIBLES: Ditropan XL Qu le debo informar a mi profesional de la salud antes de tomar este medicamento? Necesita saber si usted presenta alguno de los siguientes problemas o situaciones: -demencia -dificultad para orinar -glaucoma -obstruccin intestinal -enfermedad renal -enfermedad heptica -una reaccin alrgica o inusual a la oxibutinina, a otros medicamentos, alimentos, colorantes o conservantes -si est embarazada o buscando quedar embarazada -si est amamantando a un beb Cmo debo utilizar este medicamento? Tome este medicamento por va oral con un vaso de agua. Ingiralo entero, no lo triture, corte ni mastique. Siga las instrucciones de la etiqueta del [place]. Este medicamento se puede tomar con o sin alimentos. Tome su medicamento a intervalos regulares. No tome su medicamento con una frecuencia mayor a la indicada. Hable con su pediatra para informarse acerca del uso de este medicamento en nios. Puede requerir atencin especial. Aunque este medicamento puede ser recetado a nios tan menores como de 6 aos de edad para condiciones selectivas, las precauciones se aplican. Sobredosis: Pngase en contacto inmediatamente con un centro toxicolgico o una sala de urgencia si usted cree que haya tomado demasiado medicamento. ATENCIN: [employer] es solo para usted. No comparta este medicamento con nadie. Qu sucede si me olvido de una dosis? Si olvida una dosis, tmela lo antes posible. Si es casi la hora de la prxima dosis, tome slo esa dosis. No tome  dosis adicionales o dobles. Qu puede interactuar con este medicamento? -antihistamnicos para Environmental consultant, tos y resfros -atropina -ciertos medicamentos para problemas de la vejiga, tales como oxibutinina, tolterodina -ciertos medicamentos para la enfermedad de Parkinson, tales como benzotropina, trihexifenidilo -ciertos medicamentos para problemas estomacales, tales como diciclomina, hiosciamina -ciertos medicamentos para los mareos provocados por el movimiento, como escopolamina -claritromicina -eritromicina -ipratropio -medicamentos para infecciones micticas, tales como fluconazol, itraconazol, quetoconazol o voriconazol Puede ser que esta lista no menciona todas las posibles interacciones. Informe a su profesional de [employer] de [employer] Inc productos a base de hierbas, medicamentos de [place] o suplementos nutritivos que est tomando. Si usted fuma, consume bebidas alcohlicas o si utiliza drogas ilegales, indqueselo tambin a su profesional de [employer]. Algunas sustancias pueden interactuar con su medicamento. A qu debo estar atento al usar PPL Corporation? Pueden transcurrir varias semanas antes de que obtenga el beneficio mximo de [place]. Es posible que sea necesario limitar la ingesta de t, caf, refrescos con cafena y alcohol. Estas bebidas pueden empeorar los sntomas. Puede experimentar mareos o somnolencia. No conduzca ni utilice maquinaria, ni haga nada que Scientist, research (life sciences) en estado de alerta hasta que sepa cmo le afecta este medicamento. No se siente ni se ponga de pie con rapidez, especialmente si es un paciente de edad avanzada. Esto ayuda a Software engineer de mareos o [place]. El alcohol puede interferir con el efecto de [place]. Evite consumir bebidas alcohlicas. Se le podr secar la boca. Masticar chicle sin azcar, chupar caramelos duros y beber agua en abundancia le ayudar a mantener la boca hmeda. Si el problema no desaparece o es severo,  consulte a su mdico. Este medicamento puede resecarle los ojos y provocar visin borrosa. Si [place] lentes de contacto,  puede sentir ciertas molestias. Las gotas lubricantes pueden ser tiles. Si el problema no desaparece o es severo, consulte a su mdico de los ojos. Tal vez pueda observar ocasionalmente el recubrimiento vaco de la tableta en las heces. Esto es normal. Evite temperaturas elevadas extremas. Este medicamento puede hacer que sude menos que lo normal. La temperatura de su cuerpo puede elevarse a niveles peligrosos, lo que puede causar un golpe de Airline pilot. Qu efectos secundarios puedo tener al [employer] Scientific este medicamento? Efectos secundarios que debe informar a su mdico o a Producer, television/film/video de la salud tan pronto como sea posible: -Therapist, art como erupcin cutnea, picazn o urticarias, hinchazn de la cara, labios o lengua -agitacin -problemas respiratorios -confusin -fiebre -rubor (enrojecimiento de la piel) -alucinaciones -prdida de la memoria -dolor o dificultad para orinar -palpitaciones -cansancio o debilidad inusual Efectos secundarios que, por lo general, no requieren atencin mdica (debe informarlos a su mdico o a su profesional de la salud si persisten o si son molestos): -estreimiento -dolor de cabeza -dificultad sexual (impotencia) Puede ser que esta lista no menciona todos los posibles efectos secundarios. Comunquese a su mdico por asesoramiento mdico Hewlett[employer]ard. Usted puede informar los efectos secundarios a la FDA por telfono al 1-800-FDA-1088. Dnde debo guardar mi medicina? Mantngala fuera del alcance de los nios. Gurdela a Sanmina-SCI, entre 15 y 30 grados C (42 y 32 grados F). Protjala de la humedad. Deseche todo el medicamento que no haya utilizado, despus de la fecha de vencimiento. ATENCIN: Este folleto es un resumen. Puede ser que no cubra toda la posible informacin. Si usted tiene preguntas acerca de  esta medicina, consulte con su mdico, su farmacutico o su profesional de Radiographer, therapeutic.  2014, Elsevier/Gold Standard. (2007-04-18 03:22:00) Coral Else hiperactiva - Adultos  (Overactive Bladder, Adult)  La vejiga tiene dos funciones que son totalmente opuestas. Una de ellas es relajarse y agrandarse para Psychologist, clinical orina (se llena como un globo), y la otra es contraerse y apretar hacia abajo de modo que pueda vaciar la orina que se ha almacenado. El correcto funcionamiento de la vejiga es una mezcla compleja de [place] funciones. El llenado y vaciado de la vejiga puede estar influenciado por:   La vejiga.  La mdula espinal.  El cerebro.  Los nervios que Zenaida Niece a la vejiga.  Otros rganos estrechamente relacionados con la vejiga, como la prstata en los hombres y la vagina en las mujeres. A medida que la vejiga se llena de orina, enva seales nerviosas al cerebro para informarle que tiene que orinar. La miccin normal requiere que la vejiga se contraiga hacia abajo con la fuerza suficiente como para vaciarla, pero tambin requiere que se contraiga el tiempo suficiente como para finalizar la accin. Adems, los msculos del esfnter, que normalmente impiden las fugas de [place], tambin deben relajarse para que la orina pueda pasar. Es necesaria la coordinacin entre el msculo de la vejiga que empuja hacia abajo y los msculos del esfnter que se relajan para que todo ocurra con normalidad.  En una vejiga hiperactiva los msculos se contraen de forma inesperada e involuntaria y esto provoca la necesidad urgente de Geographical information systems officer. La respuesta normal es tratar de Insurance risk surveyor orina contrayendo los msculos del esfnter. A veces, la vejiga se contrae con tanta fuerza que los msculos del esfnter no pueden impedir que la orina pase y se produce la incontinencia. Este tipo de incontinencia se llama incontinencia a la urgencia.  Cuando la vejiga es hiperactiva puede haber  situaciones embarazosas. Puede interferir en  vivir la vida de la manera que usted desea. Muchas personas piensan que es algo que deben soportar debido al envejecimiento o a problemas de [place]. Sin embargo, existen tratamientos que pueden ayudar a Radio producer su vida menos complicada y ms agradable.  CAUSAS  Son [employer] factores que pueden producir una vejiga hiperactiva. Ellos son:   Infeccin del tracto urinario o infeccin de los tejidos cercanos, como la prstata.  Agrandamiento de la prstata.  En las mujeres, los embarazos mltiples o una ciruga en el tero o la uretra.  Clculos , inflamacin o tumores en la vejiga.  La cafena.  El alcohol.  Medicamentos. Por ejemplo, los diurticos (medicamentos que ayudan al cuerpo a Animator del exceso de lquido) aumentan la produccin de [place]. Algunos medicamentos que deben tomarse con mucho lquido.  Debilidad muscular o nerviosa. Esto podra ser el resultado de una lesin de la mdula espinal, un ictus, esclerosis mltiple o enfermedad de Parkinson.  La diabetes puede producir un alto volumen de orina que llena la vejiga tan rpidamente que el impulso normal de orinar se activa muy intensamente. SNTOMAS   Prdida del control de la vejiga. Siente necesidad de Geographical information systems officer y no Sport and exercise psychologist.  Urgencia repentina de orinar.  Orina 8 o ms veces por da.  Levantarse para ArvinMeritor o ms veces por noche. DIAGNSTICO  Para diagnosticar vejiga hiperactiva, el mdico probablemente:   Preguntar sobre los sntomas que ha notado.  Preguntar acerca de su salud en general. Incluir preguntas sobre cualquier medicamento que est tomando.  Realizar un examen fsico. Esto ayudar a determinar si hay obstrucciones evidentes u otros problemas.  Indicar algunos exmenes. Pueden incluir:  Un anlisis de sangre para Engineer, manufacturing diabetes u otros problemas de salud que podran estar contribuyendo al problema.  Anlisis de [place]. Incluir la medicin del flujo de [place] y la presin sobre la  vejiga.  Un estudio del sistema neurolgico (cerebro, mdula espinal y nervios). Este es el sistema que detecta la necesidad de Geographical information systems officer. Algunas de [employer]S Inc son las pruebas de flujo, pruebas de presin en la vejiga y mediciones elctricas del msculo del Corporate investment banker.  Un estudio de la vejiga para comprobar si se vaca completamente al ConocoPhillips.  Una citoscopa. En este estudio se [place] un tubo delgado con una pequea cmara. Ofrece la visin del interior de la uretra y la vejiga para ver si hay algn problema.  Diagnstico por imgenes. Le administrarn una sustancia de contraste y The TJX Companies pedirn que orine. Se toman radiografas para ver el funcionamiento de la vejiga. TRATAMIENTO  Una vejiga hiperactiva puede tratarse de Raytheon. El tratamiento depende de la causa. Tambin vara si su caso es leve o grave. Generalmente el tratamiento se puede administrar en el consultorio mdico o en la clnica. Asegrese de [employer] Products opciones con su mdico. Las opciones pueden ser:   Tratamientos conductuales. No incluyen medicamentos ni ciruga:  Entrenamiento de la vejiga. En este caso debe seguir un programa de horarios para orinar a intervalos regulares. Esto le ayudar a aprender a Chief Operating Officer las ganas de Geographical information systems officer. Al principio, es posible que le indiquen que espere unos minutos despus de sentir deseos de Geographical information systems officer. Con el tiempo, usted debe ser capaz de programar ir al bao con ms de una hora de distancia.  Ejercicios de Kegel. Estos ejercicios fortalecen los msculos del piso plvico, que sostienen la vejiga. Al tonificar estos msculos, podr controlar la necesidad de Geographical information systems officer, incluso si los msculos  de la vejiga son hiperactivos. Un especialista le ensear cmo hacer estos ejercicios correctamente. Se requerir prctica diaria.  Prdida de peso. Si usted es obeso o tiene sobrepeso, perder peso podra mejorar la vejiga hiperactiva. Hable con su mdico acerca de cuntos kilos debe  perder. Tambin pregunte si hay un programa o mtodo especfico que funcione mejor para usted.  Cambio de dieta. Podran sugerirle esta opcin si el estreimiento empeora su vejiga hiperactiva. El mdico o un nutricionista pueden explicarle las formas de modificar su dieta para Educational psychologist. Otras personas mejorarn si toman menos cafena o alcohol. En algunos casos es necesario beber menos lquidos.  Proteccin. Esto no es Art therapist. Sin embargo, podra usar apsitos especiales para detener eventuales prdidas mientras espera que otros tratamientos sean efectivos. Esto le ayudar a evitar situaciones vergonzantes.  Tratamientos fsicos.  Estimulacin elctrica. Los electrodos envan Computer Sciences Corporation a los nervios o a los msculos que ayudan a Scientist, physiological vejiga. El objetivo es fortalecerlos. En algunos casos esto se hace con los electrodos fuera del cuerpo. O bien, pueden colocarlos en el interior del cuerpo (implante). Este tratamiento puede demorar varios meses en surtir [employer] Life.  Medicamentos. stos se utilizan generalmente junto con otros tratamientos. Hay varios medicamentos disponibles. Algunos se inyectan en los msculos que intervienen en la miccin. Otros vienen en forma de pldora. Los medicamentos que se prescriben incluyen:  Anticolinrgicos. Estos medicamentos bloquean las seales que los nervios envan a la vejiga. Impiden la liberacin de la orina cuando no es el momento [place]. Los investigadores consideran que los medicamentos podran ayudar tambin de [place].  Imipramina. Es un antidepresivo. Relaja msculos de la vejiga.  Botox. Todava se encuentra en etapa experimental. Algunas personas creen que al Toll Brothers de la vejiga, estos se relajan para que trabajen con mayor normalidad. Tambin se ha inyectado en el msculo del esfnter cuando no se abre correctamente. Sin embargo, se trata de una solucin transitoria. Tambin, podra  Dillard's, sobre todo Charles Schwab.  Ciruga.  Puede implantarse un dispositivo para ayudar a Sunoco. Funciona en los nervios que envan seales cuando hay necesidad de orinar.  La ciruga a veces es necesaria con Advice worker. Si se implantan electrodos, se hace a travs de la [place].  A veces la reparacin debe hacerse a travs de la [place]. Por ejemplo, el tamao de la vejiga se puede cambiar. Generalmente slo en casos graves. INSTRUCCIONES PARA EL CUIDADO EN EL HOGAR   Tome todos los medicamentos que el mdico le recete o Radiographer, therapeutic. Siga cuidadosamente las indicaciones.  Haga los cambios de estilo de vida que se le indican. Pueden incluir:  Beber menos lquido o beber en diferentes momentos del da. Si necesita orinar con frecuencia durante la noche, por ejemplo, es posible que tenga que dejar de tomar lquidos temprano por la noche.  Reducir el consumo de cafena o alcohol. Ambos pueden empeorar la vejiga hiperactiva. La cafena se encuentra en el caf, el t y [place].  Hacer ejercicios de Kegel para fortalecer los msculos.  Bajar de peso, si se lo indican.  Consuma una dieta saludable y equilibrada. Esto le ayudar a Chief Strategy Officer.  Lleve un diario o un registro. Es posible que se le pida que registre la cantidad que bebe y [place], y tambin cuando se sienta la necesidad de Geographical information systems officer.  Aprenda cmo cuidar los implantes u otros dispositivos, tales como pesarios. SOLICITE ATENCIN MDICA SI:  La vejiga hiperactiva empeora.  Siente dolor o tiene irritacin al ConocoPhillips.  Observa sangre en la orina.  Tiene preguntas sobre cualquier medicamento o dispositivos que su mdico indique.  Nota sangre, pus o hinchazn en el lugar en que le han realizado las pruebas o procedimientos de [place].  La temperatura oral se eleva sin motivo por arriba de 102F (38,9C). SOLICITE ATENCIN MDICA DE INMEDIATO SI:  La  temperatura oral le sube a ms de 102 (38,9C) y no puede bajarla con medicamentos.  Document Released: 04/27/2012 [facility] Patient Information 2014 [place], [place].

## 2013-05-05 LAB — URINALYSIS W MICROSCOPIC + REFLEX CULTURE
Casts: NONE SEEN
Glucose, UA: NEGATIVE mg/dL
Hgb urine dipstick: NEGATIVE
Ketones, ur: NEGATIVE mg/dL
Leukocytes, UA: NEGATIVE
Specific Gravity, Urine: 1.016 (ref 1.005–1.030)
pH: 5.5 (ref 5.0–8.0)

## 2013-05-06 LAB — URINE CULTURE: Colony Count: 50000

## 2013-05-08 ENCOUNTER — Other Ambulatory Visit: Payer: Self-pay | Admitting: Gynecology

## 2013-05-08 MED ORDER — FLUCONAZOLE 150 MG PO TABS: 150.0000 mg | ORAL_TABLET | Freq: Once | ORAL | Status: DC

## 2013-08-21 ENCOUNTER — Ambulatory Visit (INDEPENDENT_AMBULATORY_CARE_PROVIDER_SITE_OTHER): Payer: BC Managed Care – PPO | Admitting: Internal Medicine

## 2013-08-21 ENCOUNTER — Encounter: Payer: Self-pay | Admitting: Internal Medicine

## 2013-08-21 VITALS — BP 115/74 | HR 70 | Temp 98.3°F | Wt 164.0 lb

## 2013-08-21 DIAGNOSIS — J309 Allergic rhinitis, unspecified: Secondary | ICD-10-CM

## 2013-08-21 DIAGNOSIS — Z0000 Encounter for general adult medical examination without abnormal findings: Secondary | ICD-10-CM

## 2013-08-21 DIAGNOSIS — E785 Hyperlipidemia, unspecified: Secondary | ICD-10-CM

## 2013-08-21 DIAGNOSIS — J45909 Unspecified asthma, uncomplicated: Secondary | ICD-10-CM | POA: Insufficient documentation

## 2013-08-21 DIAGNOSIS — Z23 Encounter for immunization: Secondary | ICD-10-CM

## 2013-08-21 MED ORDER — ALBUTEROL SULFATE HFA 108 (90 BASE) MCG/ACT IN AERS: 2.0000 | INHALATION_SPRAY | Freq: Four times a day (QID) | RESPIRATORY_TRACT | Status: DC | PRN

## 2013-08-21 MED ORDER — FLUTICASONE PROPIONATE 50 MCG/ACT NA SUSP: 2.0000 | Freq: Every day | NASAL | Status: DC

## 2013-08-21 NOTE — Assessment & Plan Note (Signed)
Last FLP satisfactory, recheck in 6 months during her physical. Encourage diet and exercise

## 2013-08-21 NOTE — Assessment & Plan Note (Signed)
Used to see allergy, Dr. Beatriz StallionBardellas, had allergy shots but didn't  help much Request a refill on Flonase which she she usually uses in the spring

## 2013-08-21 NOTE — Patient Instructions (Signed)
  Next visit is for a physical exam in 6 months , fasting Please make an appointment      

## 2013-08-21 NOTE — Progress Notes (Signed)
Pre visit review using our clinic review tool, if applicable. No additional management support is needed unless otherwise documented below in the visit note. 

## 2013-08-21 NOTE — Assessment & Plan Note (Signed)
Gyn care per gynecology. Needs a Tdap, provided today Followup us in 6 months for a physical

## 2013-08-21 NOTE — Assessment & Plan Note (Signed)
Used to see allergy, Dr. Beatriz StallionBardellas, had allergy shots but didn't  help much Request a refill on albuterol, she usses sporadically usually in the spring

## 2013-08-21 NOTE — Progress Notes (Signed)
Subjective:    Patient ID: Beth Blackwell, female    DOB: [birth year], 38 y.o.   MRN: [id number]  DOS:  08/21/2013 Type of  visit:  ROV Seizure disorder, saw neurology, felt to be stable, followup in one year . Used to see an allergist regards allergic rhinitis and bronchospasm, see assessment and plan. Hyperlipidemia, diet and exercise not very good in the last few months   For the last few days is having mild frontal headache and sinus pressure, some sore throat     ROS Denies fever or chills No  chest pain or difficulty breathing No nausea, vomiting, diarrhea or myalgias  Past Medical History  Diagnosis Date  . Hyperlipidemia, mixed   . Vitamin D deficiency   . Chronic headaches   . Seizures   . H/O: hysterectomy   . Depression   . Sickle cell trait   . Allergic rhinitis 08/21/2013    Past Surgical History  Procedure Laterality Date  . Ovarian cyst surgery    . Appendectomy    . Cesarean section      X2  . Hand surgery  2012    RIGHT WRIST/ FOREARM  . Tubal ligation      HULKA CLIP TECHNIQUE  . Abdominal hysterectomy  12/28/2005    TAH/ SEPTATE UTERUS    History   Social History  . Marital Status: Married    Spouse Name: N/A    Number of Children: 2  . Years of Education: N/A   Occupational History  . 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 Smoking status: Never Smoker   . Smokeless tobacco: Never Used  . Alcohol Use: No  . Drug Use: No  . Sexual Activity: Yes    Partners: Male    Birth Control/ Protection: Surgical     Comment: TAH   Other Topics Concern  . Not on file   Social History Narrative   Original from [place]        Medication List       This list is accurate as of: 08/21/13  6:27 PM.  Always use your most recent med list.               albuterol 108 (90 BASE) MCG/ACT inhaler  Commonly known as:  VENTOLIN HFA  Inhale 2 puffs into the lungs every 6 (six) hours as needed for  wheezing or shortness of breath.     CALTRATE 600+D 600-400 MG-UNIT per tablet  Generic drug:  Calcium Carbonate-Vitamin D  Take 1 tablet by mouth daily.     cetirizine 10 MG tablet  Commonly known as:  ZYRTEC  Take 10 mg by mouth daily.     fluticasone 50 MCG/ACT nasal spray  Commonly known as:  FLONASE  Place 2 sprays into both nostrils daily.     gabapentin 600 MG tablet  Commonly known as:  NEURONTIN  Take 1 tablet (600 mg total) by mouth 3 (three) times daily.     levETIRAcetam 500 MG tablet  Commonly known as:  KEPPRA  Take 2 tablets (1,000 mg total) by mouth 2 (two) times daily.     Omega 3 1000 MG Caps  Take by mouth.     oxybutynin 10 MG 24 hr tablet  Commonly known as:  DITROPAN-XL  Take 1 tablet (10 mg total) by mouth at bedtime.     pravastatin 20 MG tablet  Commonly known as:  PRAVACHOL  TAKE 1 TABLET  BY MOUTH EVERY DAY     vitamin C 1000 MG tablet  Take 1,000 mg by mouth daily.           Objective:   Physical Exam BP 115/74  Pulse 70  Temp(Src) 98.3 F (36.8 C)  Wt 164 lb (74.39 kg)  SpO2 95%  General -- alert, well-developed, NAD.  HEENT-- Not pale. TMs normal, throat symmetric, no redness or discharge. Face symmetric, sinuses not tender to palpation. Nose not congested.  Lungs -- normal respiratory effort, no intercostal retractions, no accessory muscle use, and normal breath sounds.  Heart-- normal rate, regular rhythm, no murmur.  Extremities-- no pretibial edema bilaterally  Neurologic--  alert & oriented X3. Speech normal, gait normal, strength normal in all extremities.   Psych-- Cognition and judgment appear intact. Cooperative with normal attention span and concentration. No anxious or depressed appearing.       Assessment & Plan:

## 2013-10-29 ENCOUNTER — Other Ambulatory Visit: Payer: Self-pay | Admitting: Internal Medicine

## 2014-01-09 ENCOUNTER — Telehealth: Payer: Self-pay | Admitting: Nurse Practitioner

## 2014-01-09 ENCOUNTER — Ambulatory Visit: Payer: BC Managed Care – PPO | Admitting: Nurse Practitioner

## 2014-01-09 NOTE — Telephone Encounter (Signed)
No showed for scheduled appointment 

## 2014-01-25 ENCOUNTER — Other Ambulatory Visit: Payer: Self-pay | Admitting: Orthopedic Surgery

## 2014-01-31 ENCOUNTER — Other Ambulatory Visit: Payer: Self-pay

## 2014-01-31 MED ORDER — LEVETIRACETAM 500 MG PO TABS: 1000.0000 mg | ORAL_TABLET | Freq: Two times a day (BID) | ORAL | Status: DC

## 2014-01-31 MED ORDER — GABAPENTIN 600 MG PO TABS: 600.0000 mg | ORAL_TABLET | Freq: Three times a day (TID) | ORAL | Status: DC

## 2014-01-31 NOTE — Telephone Encounter (Signed)
Patient has appt scheduled at the end of Nov

## 2014-01-31 NOTE — Telephone Encounter (Signed)
Patient has an appt scheduled in late Nov

## 2014-02-22 DIAGNOSIS — Z969 Presence of functional implant, unspecified: Secondary | ICD-10-CM

## 2014-02-22 HISTORY — DX: Presence of functional implant, unspecified: Z96.9

## 2014-02-23 ENCOUNTER — Encounter: Payer: BC Managed Care – PPO | Admitting: Internal Medicine

## 2014-02-23 ENCOUNTER — Telehealth: Payer: Self-pay | Admitting: *Deleted

## 2014-02-23 DIAGNOSIS — Z0289 Encounter for other administrative examinations: Secondary | ICD-10-CM

## 2014-02-23 NOTE — Telephone Encounter (Signed)
Please reschedule

## 2014-02-23 NOTE — Telephone Encounter (Signed)
Pt did not show for appointment 02/23/2014 at 8am for CPE

## 2014-03-05 ENCOUNTER — Encounter (HOSPITAL_BASED_OUTPATIENT_CLINIC_OR_DEPARTMENT_OTHER): Payer: Self-pay | Admitting: *Deleted

## 2014-03-08 NOTE — Pre-Procedure Instructions (Signed)
Renae Fickleaul will be interpreter for pt's husband post-op, [facility]; please call [phone] if surgery time changes.

## 2014-03-09 ENCOUNTER — Ambulatory Visit (HOSPITAL_BASED_OUTPATIENT_CLINIC_OR_DEPARTMENT_OTHER): Payer: BC Managed Care – PPO | Admitting: Anesthesiology

## 2014-03-09 ENCOUNTER — Encounter (HOSPITAL_BASED_OUTPATIENT_CLINIC_OR_DEPARTMENT_OTHER)
Admission: RE | Disposition: A | Discharge status: Home / Self Care | Payer: Self-pay | Source: Ambulatory Visit | Attending: Orthopedic Surgery

## 2014-03-09 ENCOUNTER — Ambulatory Visit (HOSPITAL_BASED_OUTPATIENT_CLINIC_OR_DEPARTMENT_OTHER)
Admission: RE | Admit: 2014-03-09 | Discharge: 2014-03-09 | Disposition: A | Discharge status: Home / Self Care | Payer: BC Managed Care – PPO | Source: Ambulatory Visit | Attending: Orthopedic Surgery | Admitting: Orthopedic Surgery

## 2014-03-09 ENCOUNTER — Encounter (HOSPITAL_BASED_OUTPATIENT_CLINIC_OR_DEPARTMENT_OTHER): Payer: BC Managed Care – PPO | Admitting: Anesthesiology

## 2014-03-09 ENCOUNTER — Encounter (HOSPITAL_BASED_OUTPATIENT_CLINIC_OR_DEPARTMENT_OTHER): Payer: Self-pay | Admitting: *Deleted

## 2014-03-09 DIAGNOSIS — R569 Unspecified convulsions: Secondary | ICD-10-CM | POA: Insufficient documentation

## 2014-03-09 DIAGNOSIS — F329 Major depressive disorder, single episode, unspecified: Secondary | ICD-10-CM | POA: Insufficient documentation

## 2014-03-09 DIAGNOSIS — D573 Sickle-cell trait: Secondary | ICD-10-CM | POA: Insufficient documentation

## 2014-03-09 DIAGNOSIS — Z886 Allergy status to analgesic agent status: Secondary | ICD-10-CM | POA: Insufficient documentation

## 2014-03-09 DIAGNOSIS — E780 Pure hypercholesterolemia: Secondary | ICD-10-CM | POA: Diagnosis not present

## 2014-03-09 DIAGNOSIS — Z464 Encounter for fitting and adjustment of orthodontic device: Secondary | ICD-10-CM | POA: Insufficient documentation

## 2014-03-09 HISTORY — DX: Other seasonal allergic rhinitis: J30.2

## 2014-03-09 HISTORY — PX: HARDWARE REMOVAL: SHX979

## 2014-03-09 HISTORY — DX: Presence of functional implant, unspecified: Z96.9

## 2014-03-09 HISTORY — DX: Pure hypercholesterolemia, unspecified: E78.00

## 2014-03-09 LAB — POCT HEMOGLOBIN-HEMACUE: Hemoglobin: 11.9 g/dL — ABNORMAL LOW (ref 12.0–15.0)

## 2014-03-09 SURGERY — REMOVAL, HARDWARE
Anesthesia: General | Site: Arm Lower | Laterality: Right

## 2014-03-09 MED ORDER — MIDAZOLAM HCL 2 MG/2ML IJ SOLN: 1.0000 mg | INTRAMUSCULAR | Status: DC | PRN

## 2014-03-09 MED ORDER — CEFAZOLIN SODIUM-DEXTROSE 2-3 GM-% IV SOLR: 2.0000 g | INTRAVENOUS | Status: DC

## 2014-03-09 MED ORDER — MIDAZOLAM HCL 2 MG/2ML IJ SOLN
INTRAMUSCULAR | Status: AC
  Filled 2014-03-09: qty 2

## 2014-03-09 MED ORDER — CHLORHEXIDINE GLUCONATE 4 % EX LIQD: 60.0000 mL | Freq: Once | CUTANEOUS | Status: DC

## 2014-03-09 MED ORDER — MIDAZOLAM HCL 5 MG/5ML IJ SOLN
INTRAMUSCULAR | Status: DC | PRN
  Administered 2014-03-09: 2 mg via INTRAVENOUS

## 2014-03-09 MED ORDER — ONDANSETRON HCL 4 MG/2ML IJ SOLN
INTRAMUSCULAR | Status: DC | PRN
  Administered 2014-03-09: 4 mg via INTRAVENOUS

## 2014-03-09 MED ORDER — BUPIVACAINE HCL (PF) 0.25 % IJ SOLN
INTRAMUSCULAR | Status: DC | PRN
  Administered 2014-03-09: 8 mL

## 2014-03-09 MED ORDER — MIDAZOLAM HCL 2 MG/ML PO SYRP: 12.0000 mg | ORAL_SOLUTION | Freq: Once | ORAL | Status: DC | PRN

## 2014-03-09 MED ORDER — HYDROMORPHONE HCL 1 MG/ML IJ SOLN
INTRAMUSCULAR | Status: AC
  Filled 2014-03-09: qty 1

## 2014-03-09 MED ORDER — SUFENTANIL CITRATE 50 MCG/ML IV SOLN
INTRAVENOUS | Status: AC
  Filled 2014-03-09: qty 1

## 2014-03-09 MED ORDER — SCOPOLAMINE 1 MG/3DAYS TD PT72
MEDICATED_PATCH | TRANSDERMAL | Status: AC
  Filled 2014-03-09: qty 1

## 2014-03-09 MED ORDER — LIDOCAINE HCL (CARDIAC) 20 MG/ML IV SOLN
INTRAVENOUS | Status: DC | PRN
  Administered 2014-03-09: 75 mg via INTRAVENOUS

## 2014-03-09 MED ORDER — LACTATED RINGERS IV SOLN
INTRAVENOUS | Status: DC
  Administered 2014-03-09 (×2): via INTRAVENOUS

## 2014-03-09 MED ORDER — FENTANYL CITRATE 0.05 MG/ML IJ SOLN: 50.0000 ug | INTRAMUSCULAR | Status: DC | PRN

## 2014-03-09 MED ORDER — OXYCODONE HCL 5 MG PO TABS: 5.0000 mg | ORAL_TABLET | Freq: Once | ORAL | Status: DC | PRN

## 2014-03-09 MED ORDER — HYDROMORPHONE HCL 1 MG/ML IJ SOLN
0.2500 mg | INTRAMUSCULAR | Status: DC | PRN
  Administered 2014-03-09 (×3): 0.5 mg via INTRAVENOUS

## 2014-03-09 MED ORDER — DEXAMETHASONE SODIUM PHOSPHATE 4 MG/ML IJ SOLN
INTRAMUSCULAR | Status: DC | PRN
  Administered 2014-03-09: 10 mg via INTRAVENOUS

## 2014-03-09 MED ORDER — KETOROLAC TROMETHAMINE 30 MG/ML IJ SOLN
INTRAMUSCULAR | Status: AC
  Filled 2014-03-09: qty 1

## 2014-03-09 MED ORDER — KETOROLAC TROMETHAMINE 30 MG/ML IJ SOLN
30.0000 mg | Freq: Once | INTRAMUSCULAR | Status: AC
  Administered 2014-03-09: 30 mg via INTRAVENOUS

## 2014-03-09 MED ORDER — ONDANSETRON HCL 4 MG/2ML IJ SOLN: 4.0000 mg | Freq: Once | INTRAMUSCULAR | Status: DC | PRN

## 2014-03-09 MED ORDER — PROPOFOL 10 MG/ML IV BOLUS
INTRAVENOUS | Status: DC | PRN
  Administered 2014-03-09: 200 mg via INTRAVENOUS

## 2014-03-09 MED ORDER — CEFAZOLIN SODIUM-DEXTROSE 2-3 GM-% IV SOLR
INTRAVENOUS | Status: AC
  Filled 2014-03-09: qty 50

## 2014-03-09 MED ORDER — BUPIVACAINE HCL (PF) 0.25 % IJ SOLN
INTRAMUSCULAR | Status: AC
  Filled 2014-03-09: qty 30

## 2014-03-09 MED ORDER — TRAMADOL HCL 50 MG PO TABS
100.0000 mg | ORAL_TABLET | Freq: Four times a day (QID) | ORAL | Status: DC | PRN
Start: 2014-03-09 — End: 2014-04-18

## 2014-03-09 MED ORDER — OXYCODONE HCL 5 MG/5ML PO SOLN: 5.0000 mg | Freq: Once | ORAL | Status: DC | PRN

## 2014-03-09 MED ORDER — SCOPOLAMINE 1 MG/3DAYS TD PT72
1.0000 | MEDICATED_PATCH | TRANSDERMAL | Status: DC
  Administered 2014-03-09: 1.5 mg via TRANSDERMAL

## 2014-03-09 MED ORDER — CEFAZOLIN SODIUM-DEXTROSE 2-3 GM-% IV SOLR
2.0000 g | INTRAVENOUS | Status: DC
  Administered 2014-03-09: 2 g via INTRAVENOUS

## 2014-03-09 SURGICAL SUPPLY — 43 items
BLADE SURG 15 STRL LF DISP TIS (BLADE) ×1 IMPLANT
BLADE SURG 15 STRL SS (BLADE) ×2
BNDG COHESIVE 3X5 TAN STRL LF (GAUZE/BANDAGES/DRESSINGS) ×3 IMPLANT
BNDG ESMARK 4X9 LF (GAUZE/BANDAGES/DRESSINGS) ×3 IMPLANT
BNDG GAUZE ELAST 4 BULKY (GAUZE/BANDAGES/DRESSINGS) ×3 IMPLANT
CHLORAPREP W/TINT 26ML (MISCELLANEOUS) ×3 IMPLANT
CORDS BIPOLAR (ELECTRODE) ×3 IMPLANT
COVER BACK TABLE 60X90IN (DRAPES) ×3 IMPLANT
COVER MAYO STAND STRL (DRAPES) ×3 IMPLANT
CUFF TOURNIQUET SINGLE 18IN (TOURNIQUET CUFF) ×3 IMPLANT
DECANTER SPIKE VIAL GLASS SM (MISCELLANEOUS) IMPLANT
DRAPE EXTREMITY TIBURON (DRAPES) ×3 IMPLANT
DRAPE OEC MINIVIEW 54X84 (DRAPES) IMPLANT
DRAPE SURG 17X23 STRL (DRAPES) ×3 IMPLANT
GAUZE SPONGE 4X4 12PLY STRL (GAUZE/BANDAGES/DRESSINGS) ×3 IMPLANT
GAUZE XEROFORM 1X8 LF (GAUZE/BANDAGES/DRESSINGS) ×3 IMPLANT
GLOVE BIOGEL PI IND STRL 7.0 (GLOVE) ×1 IMPLANT
GLOVE BIOGEL PI IND STRL 8.5 (GLOVE) ×1 IMPLANT
GLOVE BIOGEL PI INDICATOR 7.0 (GLOVE) ×2
GLOVE BIOGEL PI INDICATOR 8.5 (GLOVE) ×2
GLOVE ECLIPSE 6.5 STRL STRAW (GLOVE) ×3 IMPLANT
GLOVE EXAM NITRILE LRG STRL (GLOVE) ×3 IMPLANT
GLOVE SURG ORTHO 8.0 STRL STRW (GLOVE) ×3 IMPLANT
GOWN STRL REUS W/ TWL LRG LVL3 (GOWN DISPOSABLE) ×1 IMPLANT
GOWN STRL REUS W/TWL LRG LVL3 (GOWN DISPOSABLE) ×2
GOWN STRL REUS W/TWL XL LVL3 (GOWN DISPOSABLE) ×3 IMPLANT
NEEDLE 27GAX1X1/2 (NEEDLE) ×3 IMPLANT
NS IRRIG 1000ML POUR BTL (IV SOLUTION) ×3 IMPLANT
PACK BASIN DAY SURGERY FS (CUSTOM PROCEDURE TRAY) ×3 IMPLANT
PAD CAST 3X4 CTTN HI CHSV (CAST SUPPLIES) ×1 IMPLANT
PADDING CAST COTTON 3X4 STRL (CAST SUPPLIES) ×2
SLEEVE SCD COMPRESS KNEE MED (MISCELLANEOUS) ×3 IMPLANT
SPLINT PLASTER CAST XFAST 3X15 (CAST SUPPLIES) ×10 IMPLANT
SPLINT PLASTER XTRA FASTSET 3X (CAST SUPPLIES) ×20
STOCKINETTE 4X48 STRL (DRAPES) ×3 IMPLANT
SUT CHROMIC 5 0 P 3 (SUTURE) IMPLANT
SUT VICRYL 4-0 PS2 18IN ABS (SUTURE) ×3 IMPLANT
SUT VICRYL RAPID 5 0 P 3 (SUTURE) IMPLANT
SUT VICRYL RAPIDE 4/0 PS 2 (SUTURE) ×3 IMPLANT
SYR BULB 3OZ (MISCELLANEOUS) ×3 IMPLANT
SYR CONTROL 10ML LL (SYRINGE) IMPLANT
TOWEL OR 17X24 6PK STRL BLUE (TOWEL DISPOSABLE) ×3 IMPLANT
UNDERPAD 30X30 INCONTINENT (UNDERPADS AND DIAPERS) ×3 IMPLANT

## 2014-03-09 NOTE — Anesthesia Postprocedure Evaluation (Signed)
  Anesthesia Post-op Note  Patient: Beth Blackwell  Procedure(s) Performed: Procedure(s): REMOVAL PLATE AND SCREW RIGHT ULNAR (Right)  Patient Location: PACU  Anesthesia Type: General   Level of Consciousness: awake, alert  and oriented  Airway and Oxygen Therapy: Patient Spontanous Breathing  Post-op Pain: mild  Post-op Assessment: Post-op Vital signs reviewed  Post-op Vital Signs: Reviewed  Last Vitals:  Filed Vitals:   03/09/14 1530  BP: 134/67  Pulse: 74  Temp:   Resp: 18    Complications: No apparent anesthesia complications

## 2014-03-09 NOTE — Discharge Instructions (Addendum)

## 2014-03-09 NOTE — Transfer of Care (Signed)
Immediate Anesthesia Transfer of Care Note  Patient: Beth Blackwell  Procedure(s) Performed: Procedure(s): REMOVAL PLATE AND SCREW RIGHT ULNAR (Right)  Patient Location: PACU  Anesthesia Type:General  Level of Consciousness: sedated  Airway & Oxygen Therapy: Patient Spontanous Breathing and Patient connected to face mask oxygen  Post-op Assessment: Report given to PACU RN and Post -op Vital signs reviewed and stable  Post vital signs: Reviewed and stable  Complications: No apparent anesthesia complications

## 2014-03-09 NOTE — Brief Op Note (Signed)
03/09/2014  3:07 PM  PATIENT:  Beth Blackwell  38 y.o. female  PRE-OPERATIVE DIAGNOSIS:  status post Ulna Shortening Osteotomy Right Forearm  POST-OPERATIVE DIAGNOSIS:  status post Ulna Shortening Osteotomy Right Forearm  PROCEDURE:  Procedure(s): REMOVAL PLATE AND SCREW RIGHT ULNAR (Right)  SURGEON:  Surgeon(s) and Role:    * Cindee SaltGary Isaih Bulger, MD - Primary  PHYSICIAN ASSISTANT:   ASSISTANTS: none   ANESTHESIA:   local and general  EBL:     BLOOD ADMINISTERED:none  DRAINS: none   LOCAL MEDICATIONS USED:  BUPIVICAINE   SPECIMEN:  No Specimen  DISPOSITION OF SPECIMEN:  N/A  COUNTS:  YES  TOURNIQUET:   Total Tourniquet Time Documented: Upper Arm (Right) - 38 minutes Total: Upper Arm (Right) - 38 minutes   DICTATION: .Other Dictation: Dictation Number [id number]  PLAN OF CARE: Discharge to home after PACU  PATIENT DISPOSITION:  PACU - hemodynamically stable.

## 2014-03-09 NOTE — Anesthesia Procedure Notes (Addendum)
Performed by: Zenia ResidesPAYNE, Aryaa Bunting D   Procedure Name: LMA Insertion Date/Time: 03/09/2014 2:16 PM Performed by: Zenia ResidesPAYNE, Linn Goetze D Pre-anesthesia Checklist: Patient identified, Emergency Drugs available, Suction available and Patient being monitored Patient Re-evaluated:Patient Re-evaluated prior to inductionOxygen Delivery Method: Circle System Utilized Preoxygenation: Pre-oxygenation with 100% oxygen Intubation Type: IV induction Ventilation: Mask ventilation without difficulty LMA: LMA inserted LMA Size: 4.0 Number of attempts: 1 Airway Equipment and Method: bite block Placement Confirmation: positive ETCO2 Tube secured with: Tape Dental Injury: Teeth and Oropharynx as per pre-operative assessment

## 2014-03-09 NOTE — Op Note (Signed)
Dictation Number [id number]

## 2014-03-09 NOTE — Anesthesia Preprocedure Evaluation (Signed)
Anesthesia Evaluation  Patient identified by MRN, date of birth, ID band Patient awake    Reviewed: Allergy & Precautions, H&P , NPO status , Patient's Chart, lab work & pertinent test results  Airway Mallampati: I TM Distance: >3 FB Neck ROM: Full    Dental  (+) Teeth Intact, Dental Advisory Given   Pulmonary  breath sounds clear to auscultation        Cardiovascular Rhythm:Regular Rate:Normal     Neuro/Psych    GI/Hepatic   Endo/Other    Renal/GU      Musculoskeletal   Abdominal   Peds  Hematology   Anesthesia Other Findings   Reproductive/Obstetrics                           Anesthesia Physical Anesthesia Plan  ASA: I  Anesthesia Plan: General   Post-op Pain Management:    Induction: Intravenous  Airway Management Planned: LMA  Additional Equipment:   Intra-op Plan:   Post-operative Plan: Extubation in OR  Informed Consent: I have reviewed the patients History and Physical, chart, labs and discussed the procedure including the risks, benefits and alternatives for the proposed anesthesia with the patient or authorized representative who has indicated his/her understanding and acceptance.   Dental advisory given  Plan Discussed with: CRNA, Anesthesiologist and Surgeon  Anesthesia Plan Comments:         Anesthesia Quick Evaluation  

## 2014-03-09 NOTE — H&P (Signed)
Beth Blackwell is a 38 year-old right-hand dominant former patient who four years ago had an ulnar shortening osteotomy, she is now having pain over the dorsoulnar aspect of her forearm and wrist especially with extension of her finger.  She is desirous of possibility of plate removal.  She has no new history of injury.   ALLERGIES:   Tylenol.   MEDICATIONS:  Gabapentin.  SURGICAL HISTORY:   No other surgery than mentioned.  FAMILY MEDICAL HISTORY:  Negative.  SOCIAL HISTORY:   She does not smoke or drink.  She is married.  REVIEW OF SYSTEMS:   Positive for seizures, otherwise negative 14 points.  Beth Blackwell Blackwell is an 38 y.o. female.   Chief Complaint: S/P ulnar shortening osteotomy right HPI: see above  Past Medical History  Diagnosis Date  . Depression   . Sickle cell trait   . Seizures     no seizures in 2 years  . High cholesterol   . Retained orthopedic hardware 02/2014    right wrist/forearm  . Seasonal allergies     Past Surgical History  Procedure Laterality Date  . Appendectomy    . Cesarean section      x 2  . Wrist arthroscopy with ulna shortening Right 06/25/2010    and TFCC debridement, shrinkage scapholunate ligament  . Tubal ligation  02/06/2000  . Abdominal hysterectomy  12/28/2005  . Ovarian cyst removal Right 09/22/2006  . Laparoscopic lysis of adhesions  09/22/2006    abdominopelvic    Family History  Problem Relation Age of Onset  . Diabetes Mother   . Hypertension Mother   . Diabetes Father   . Hypertension Father    Social History:  reports that she has never smoked. She has never used smokeless tobacco. She reports that she does not drink alcohol or use illicit drugs.  Allergies:  Allergies  Allergen Reactions  . Acetaminophen Shortness Of Breath    Medications Prior to Admission  Medication Sig Dispense Refill  . albuterol (VENTOLIN HFA) 108 (90 BASE) MCG/ACT inhaler Inhale 2 puffs into the lungs every 6 (six) hours as needed for wheezing or  shortness of breath.  1 Inhaler  1  . Calcium Carbonate-Vitamin D (CALTRATE 600+D) 600-400 MG-UNIT per tablet Take 1 tablet by mouth daily.        . cetirizine (ZYRTEC) 10 MG tablet Take 10 mg by mouth daily.      . fluticasone (FLONASE) 50 MCG/ACT nasal spray Place 2 sprays into both nostrils daily.  16 g  6  . gabapentin (NEURONTIN) 600 MG tablet Take 1 tablet (600 mg total) by mouth 3 (three) times daily.  270 tablet  0  . levETIRAcetam (KEPPRA) 500 MG tablet Take 2 tablets (1,000 mg total) by mouth 2 (two) times daily.  360 tablet  0  . OMEGA 3 1000 MG CAPS Take by mouth.        . oxybutynin (DITROPAN-XL) 10 MG 24 hr tablet Take 1 tablet (10 mg total) by mouth at bedtime.  30 tablet  11  . pravastatin (PRAVACHOL) 20 MG tablet TAKE 1 TABLET BY MOUTH EVERY DAY  30 tablet  6    Results for orders placed during the hospital encounter of 03/09/14 (from the past 48 hour(s))  POCT HEMOGLOBIN-HEMACUE     Status: Abnormal   Collection Time    03/09/14 12:52 PM      Result Value Ref Range   Hemoglobin 11.9 (*) 12.0 - 15.0 g/dL    No  results found.   Pertinent items are noted in HPI.  Blood pressure 132/77, pulse 58, temperature 98 F (36.7 C), temperature source Oral, resp. rate 18, height 5\' 3"  (1.6 m), weight 72.349 kg (159 lb 8 oz), SpO2 100.00%.  General appearance: alert, cooperative and appears stated age Head: Normocephalic, without obvious abnormality, asymmetric shape Neck: no JVD Resp: clear to auscultation bilaterally Cardio: regular rate and rhythm, S1, S2 normal, no murmur, click, rub or gallop GI: soft, non-tender; bowel sounds normal; no masses,  no organomegaly Extremities: tender over plat and screws right forearm Pulses: 2+ and symmetric Skin: Skin color, texture, turgor normal. No rashes or lesions Neurologic: Grossly normal Incision/Wound: na  Assessment/Plan RADIOGRAPHS:   X-rays reveal the osteotomy healed.   DIAGNOSIS:   Plate irritation.     RECOMMENDATIONS/PLAN:   We will plan on removal of this, she is aware of risks and complications including infection, recurrence.  She is aware that she will be in a splint for six weeks following this and she will be   Lanissa Cashen R 03/09/2014, 1:41 PM

## 2014-03-10 NOTE — Op Note (Signed)
NAMLeola [place]:  TADEO-BRAVO,                 ACCOUNT NO.:  [id number]  MEDICAL RECORD NO.:  [id number]  LOCATION:                                 FACILITY:  PHYSICIAN:  Cindee SaltGary Dinari Stgermaine, M.D.            DATE OF BIRTH:  DATE OF PROCEDURE:  03/09/2014 DATE OF DISCHARGE:                              OPERATIVE REPORT   PREOPERATIVE DIAGNOSIS:  Status post ulnar shortening osteotomy, right forearm.  POSTOPERATIVE DIAGNOSIS:  Status post ulnar shortening osteotomy, right forearm.  OPERATION:  Removal of plate and screws, right forearm, ulna.  SURGEON:  Cindee SaltGary Yu Peggs, MD  ANESTHESIA:  General with local infiltration.  ANESTHESIOLOGIST:  Sheldon Silvanavid Crews, MD  HISTORY:  The patient is a 38 year old female with a history of an ulnar shortening osteotomy.  She has had discomfort from the plate and screws and desires having these removed.  Pre, peri, and postoperative course have been discussed along with risks and complications.  She is aware there is no guarantee with the surgery, possibility of infection, recurrence of injury to arteries, nerves, and tendons, incomplete release of symptoms and dystrophy, the possibility of fracture following removal of plate and screws.  The preoperative area, the patient is seen, the extremity marked by both patient and surgeon, antibiotic given.  PROCEDURE IN DETAIL:  The patient was brought to the operating room where general anesthetic was carried out without difficulty.  She was prepped using ChloraPrep, supine position with the right arm free.  A 3- minute dry time was allowed.  Time-out taken confirming the patient and procedure.  The old incision was used, carried down through subcutaneous tissue.  Bleeders were electrocauterized.  The dissection carried between the extensor carpi ulnaris and flexor carpi ulnaris.  The periosteum was incised.  The extensor carpi ulnaris was then elevated revealing the plate.  Retractors were placed.  Scarring over the plate was  then removed.  The screws were removed without difficulty. Significant bone had grown up about the plate.  This was removed with an osteotome.  The plate was then removed without difficulty.  The area was debrided, irrigated.  The fascia was then closed with a running 4-0 Vicryl suture, the subcutaneous tissue with interrupted 4-0 Vicryl, the skin with interrupted 4-0 Vicryl Rapide.  A sterile compressive dressing, dorsal palmar splint applied.  On deflation of the tourniquet, all fingers immediately pinked.  This was inflated to 250 mmHg at the beginning of the surgery.  The limb exsanguinated with an Esmarch bandage.  It should also  be noted that a time-out was taken.  A prep was done with ChloraPrep and a 3-minute dry time was allowed.  The patient tolerated the procedure well and was taken to the recovery room for observation in satisfactory condition. She will be discharged home [facility] [place] [facility] on [place].          ______________________________ Cindee SaltGary Tou Hayner, M.D.     GK/MEDQ  D:  03/09/2014  T:  03/10/2014  Job:  [id number]

## 2014-03-12 ENCOUNTER — Encounter (HOSPITAL_BASED_OUTPATIENT_CLINIC_OR_DEPARTMENT_OTHER): Payer: Self-pay | Admitting: Orthopedic Surgery

## 2014-03-26 ENCOUNTER — Encounter (HOSPITAL_BASED_OUTPATIENT_CLINIC_OR_DEPARTMENT_OTHER): Payer: Self-pay | Admitting: Orthopedic Surgery

## 2014-04-09 NOTE — Telephone Encounter (Signed)
CPE rescheduled for 06/07/2014 at 9am

## 2014-04-18 ENCOUNTER — Ambulatory Visit (INDEPENDENT_AMBULATORY_CARE_PROVIDER_SITE_OTHER): Payer: BC Managed Care – PPO | Admitting: Neurology

## 2014-04-18 ENCOUNTER — Encounter: Payer: Self-pay | Admitting: Neurology

## 2014-04-18 VITALS — BP 113/74 | HR 66 | Ht 63.5 in | Wt 162.6 lb

## 2014-04-18 DIAGNOSIS — G40909 Epilepsy, unspecified, not intractable, without status epilepticus: Secondary | ICD-10-CM

## 2014-04-18 MED ORDER — LEVETIRACETAM 500 MG PO TABS: 1000.0000 mg | ORAL_TABLET | Freq: Two times a day (BID) | ORAL | Status: DC

## 2014-04-18 MED ORDER — GABAPENTIN 600 MG PO TABS: 600.0000 mg | ORAL_TABLET | Freq: Three times a day (TID) | ORAL | Status: DC

## 2014-04-18 NOTE — Progress Notes (Signed)
Reason for visit: Seizures  Beth Blackwell is an 38 y.o. female  History of present illness:  Beth Blackwell is a 38 year old right-handed Hispanic female with a history of nocturnal seizure events. The patient also has frequent episodes of brief tremors affecting the right hand and arm. The patient indicates that she may have up to 5 such events daily, but this has been going on for several years. The patient has had 2 generalized seizures at nighttime, but she has not had any events since 2011. She is being treated with Keppra and gabapentin, and she is tolerating the medications well. The patient does operate a motor vehicle without difficulty. She returns to this office for an evaluation. She reports no other new medical issues that have come up since last seen.  Past Medical History  Diagnosis Date  . Depression   . Sickle cell trait   . Seizures     no seizures in 2 years  . High cholesterol   . Retained orthopedic hardware 02/2014    right wrist/forearm  . Seasonal allergies     Past Surgical History  Procedure Laterality Date  . Appendectomy    . Cesarean section      x 2  . Wrist arthroscopy with ulna shortening Right 06/25/2010    and TFCC debridement, shrinkage scapholunate ligament  . Tubal ligation  02/06/2000  . Abdominal hysterectomy  12/28/2005  . Ovarian cyst removal Right 09/22/2006  . Laparoscopic lysis of adhesions  09/22/2006    abdominopelvic  . Hardware removal Right 03/09/2014    Procedure: REMOVAL PLATE AND SCREW RIGHT ULNAR;  Surgeon: Cindee SaltGary Kuzma, MD;  Location: [facility];  Service: Orthopedics;  Laterality: Right;    Family History  Problem Relation Age of Onset  . Diabetes Mother   . Hypertension Mother   . Diabetes Father   . Hypertension Father   . Hypertension Sister   . Seizures Neg Hx     Social history:  reports that she has never smoked. She has never used smokeless tobacco. She reports that she does not drink alcohol  or use illicit drugs.    Allergies  Allergen Reactions  . Acetaminophen Shortness Of Breath    Medications:  Current Outpatient Prescriptions on File Prior to Visit  Medication Sig Dispense Refill  . albuterol (VENTOLIN HFA) 108 (90 BASE) MCG/ACT inhaler Inhale 2 puffs into the lungs every 6 (six) hours as needed for wheezing or shortness of breath. 1 Inhaler 1  . Calcium Carbonate-Vitamin D (CALTRATE 600+D) 600-400 MG-UNIT per tablet Take 1 tablet by mouth daily.      . cetirizine (ZYRTEC) 10 MG tablet Take 10 mg by mouth daily.    . fluticasone (FLONASE) 50 MCG/ACT nasal spray Place 2 sprays into both nostrils daily. (Patient taking differently: Place 2 sprays into both nostrils as needed. ) 16 g 6  . OMEGA 3 1000 MG CAPS Take by mouth.      . oxybutynin (DITROPAN-XL) 10 MG 24 hr tablet Take 1 tablet (10 mg total) by mouth at bedtime. 30 tablet 11  . pravastatin (PRAVACHOL) 20 MG tablet TAKE 1 TABLET BY MOUTH EVERY DAY 30 tablet 6   No current facility-administered medications on file prior to visit.    ROS:  Out of a complete 14 system review of symptoms, the patient complains only of the following symptoms, and all other reviewed systems are negative.  History of seizures Tremor  Blood pressure 113/74, pulse 66, height  5' 3.5" (1.613 m), weight 162 lb 9.6 oz (73.755 kg).  Physical Exam  General: The patient is alert and cooperative at the time of the examination. The patient is moderately obese.  Skin: No significant peripheral edema is noted.   Neurologic Exam  Mental status: The patient is oriented x 3.  Cranial nerves: Facial symmetry is present. Speech is normal, no aphasia or dysarthria is noted. Extraocular movements are full. Visual fields are full.  Motor: The patient has good strength in all 4 extremities.  Sensory examination: Soft touch sensation on the face and arms are symmetric.  Coordination: The patient has good finger-nose-finger and heel-to-shin  bilaterally.  Gait and station: The patient has a normal gait. Tandem gait is normal. Romberg is negative. No drift is seen.  Reflexes: Deep tendon reflexes are symmetric.   Assessment/Plan:  1. Nocturnal seizures  The patient is doing quite well at this time on Keppra and gabapentin. She will remain on these medications, and she will follow-up in one year. A prescription was given for the gabapentin and Keppra.  Marlan Palau. Keith Kadee Philyaw MD 04/18/2014 7:16 PM  [facility] [address] [place] [id number]  Phone [phone] Fax [phone]

## 2014-04-18 NOTE — Patient Instructions (Signed)

## 2014-05-07 ENCOUNTER — Encounter: Payer: Self-pay | Admitting: Gynecology

## 2014-05-07 ENCOUNTER — Ambulatory Visit (INDEPENDENT_AMBULATORY_CARE_PROVIDER_SITE_OTHER): Payer: BC Managed Care – PPO | Admitting: Gynecology

## 2014-05-07 VITALS — BP 116/70 | Ht 62.5 in | Wt 162.0 lb

## 2014-05-07 DIAGNOSIS — Z01419 Encounter for gynecological examination (general) (routine) without abnormal findings: Secondary | ICD-10-CM

## 2014-05-07 DIAGNOSIS — G44001 Cluster headache syndrome, unspecified, intractable: Secondary | ICD-10-CM

## 2014-05-07 DIAGNOSIS — N898 Other specified noninflammatory disorders of vagina: Secondary | ICD-10-CM

## 2014-05-07 DIAGNOSIS — N3281 Overactive bladder: Secondary | ICD-10-CM

## 2014-05-07 DIAGNOSIS — Z8639 Personal history of other endocrine, nutritional and metabolic disease: Secondary | ICD-10-CM

## 2014-05-07 LAB — WET PREP FOR TRICH, YEAST, CLUE: Trich, Wet Prep: NONE SEEN

## 2014-05-07 LAB — HM PAP SMEAR

## 2014-05-07 MED ORDER — TINIDAZOLE 500 MG PO TABS: ORAL_TABLET | ORAL | Status: DC

## 2014-05-07 MED ORDER — FLUCONAZOLE 150 MG PO TABS: 150.0000 mg | ORAL_TABLET | Freq: Once | ORAL | Status: DC

## 2014-05-07 NOTE — Patient Instructions (Addendum)
Influenza Virus Vaccine injection (Fluarix) Qu es este medicamento? La VACUNA ANTIGRIPAL ayuda a disminuir el riesgo de contraer la influenza, tambin conocida como la gripe. La vacuna solo ayuda a protegerle contra algunas cepas de influenza. Esta vacuna no ayuda a reducir Catering manager de contraer influenza pandmica [id number]. Este medicamento puede ser utilizado para otros usos; si tiene alguna pregunta consulte con su proveedor de atencin mdica o con su farmacutico. MARCAS COMERCIALES DISPONIBLES: Fluarix, Fluzone Qu le debo informar a mi profesional de la salud antes de tomar este medicamento? Necesita saber si usted presenta alguno de los siguientes problemas o situaciones: -trastorno de sangrado como hemofilia -fiebre o infeccin -sndrome de Guillain-Barre u otros problemas neurolgicos -problemas del sistema inmunolgico -infeccin por el virus de la inmunodeficiencia humana (VIH) o SIDA -niveles bajos de plaquetas en la sangre -esclerosis mltiple -una Risk analyst o inusual a las vacunas antigripales, a los huevos, protenas de pollo, al ltex, a la gentamicina, a otros medicamentos, alimentos, colorantes o conservantes -si est embarazada o buscando quedar embarazada -si est amamantando a un beb Cmo debo utilizar este medicamento? Esta vacuna se administra mediante inyeccin por va intramuscular. Lo administra un profesional de KB Home	[place]. Recibir una copia de informacin escrita sobre la vacuna antes de cada vacuna. Asegrese de leer este folleto cada vez cuidadosamente. Este folleto puede cambiar con frecuencia. Hable con su pediatra para informarse acerca del uso de este medicamento en nios. Puede requerir atencin especial. Sobredosis: Pngase en contacto inmediatamente con un centro toxicolgico o una sala de urgencia si usted cree que haya tomado demasiado medicamento. ATENCIN: [employer] es solo para usted. No comparta este medicamento con nadie. Qu sucede  si me olvido de una dosis? No se aplica en este caso. Qu puede interactuar con este medicamento? -quimioterapia o radioterapia -medicamentos que suprimen el sistema inmunolgico, tales como etanercept, anakinra, infliximab y adalimumab -medicamentos que tratan o previenen cogulos sanguneos, como warfarina -fenitona -medicamentos esteroideos, como la prednisona o la cortisona -teofilina -vacunas Puede ser que esta lista no menciona todas las posibles interacciones. Informe a su profesional de KB Home	[place] de [employer] productos a base de hierbas, medicamentos de [employer] [place] o suplementos nutritivos que est tomando. Si usted fuma, consume bebidas alcohlicas o si utiliza drogas ilegales, indqueselo tambin a su profesional de KB Home	[place]. Algunas sustancias pueden interactuar con su medicamento. A qu debo estar atento al usar [employer]? Informe a su mdico o a Barrister's clerk de la [employer] todos los efectos secundarios que persistan despus de 3 das. Llame a su proveedor de atencin mdica si se presentan sntomas inusuales dentro de las 6 semanas posteriores a la vacunacin. Es posible que todava pueda contraer la gripe, pero la enfermedad no ser tan fuerte como normalmente. No puede contraer la gripe de esta vacuna. La vacuna antigripal no le protege contra resfros u otras enfermedades que pueden causar [place]. Debe vacunarse cada ao. Qu efectos secundarios puedo tener al [employer] este medicamento? Efectos secundarios que debe informar a su mdico o a Barrister's clerk de la salud tan pronto como sea posible: -reacciones alrgicas como erupcin cutnea, picazn o urticarias, hinchazn de la cara, labios o lengua Efectos secundarios que, por lo general, no requieren atencin mdica (debe informarlos a su mdico o a su profesional de la salud si persisten o si son molestos): -fiebre -dolor de cabeza -molestias y dolores musculares -dolor, sensibilidad, enrojecimiento o Database administrator de la inyeccin -cansancio o debilidad Puede ser que [employer]  no menciona todos los posibles efectos secundarios. Comunquese a su mdico por asesoramiento mdico [employer]. Usted puede informar los efectos secundarios a la FDA por telfono al 1-800-FDA-1088. Dnde debo guardar mi medicina? Esta vacuna se administra solamente en clnicas, farmacias, consultorio mdico u otro consultorio de un profesional de la salud y no Sports coach en su domicilio. ATENCIN: Este folleto es un resumen. Puede ser que no cubra toda la posible informacin. Si usted tiene preguntas acerca de esta medicina, consulte con su mdico, su farmacutico o su profesional de Technical sales engineer.  2015, Elsevier/Gold Standard. (2009-11-12 15:31:40) Fluconazole tablets What is this medicine? FLUCONAZOLE (floo KON na zole) is an antifungal medicine. It is used to treat certain kinds of fungal or yeast infections. This medicine may be used for other purposes; ask your health care provider or pharmacist if you have questions. COMMON BRAND NAME(S): Diflucan What should I tell my health care provider before I take this medicine? They need to know if you have any of these conditions: -electrolyte abnormalities -history of irregular heart beat -kidney disease -an unusual or allergic reaction to fluconazole, other azole antifungals, medicines, foods, dyes, or preservatives -pregnant or trying to get pregnant -breast-feeding How should I use this medicine? Take this medicine by mouth. Follow the directions on the prescription label. Do not take your medicine more often than directed. Talk to your pediatrician regarding the use of this medicine in children. Special care may be needed. This medicine has been used in children as young as 68 months of age. Overdosage: If you think you have taken too much of this medicine contact a poison control center or emergency room at once. NOTE: This medicine is only  for you. Do not share this medicine with others. What if I miss a dose? If you miss a dose, take it as soon as you can. If it is almost time for your next dose, take only that dose. Do not take double or extra doses. What may interact with this medicine? Do not take this medicine with any of the following medications: -astemizole -certain medicines for irregular heart beat like dofetilide, dronedarone, quinidine -cisapride -erythromycin -lomitapide -other medicines that prolong the QT interval (cause an abnormal heart rhythm) -pimozide -terfenadine -thioridazine -tolvaptan -ziprasidone This medicine may also interact with the following medications: -antiviral medicines for HIV or AIDS -birth control pills -certain antibiotics like rifabutin, rifampin -certain medicines for blood pressure like amlodipine, isradipine, felodipine, hydrochlorothiazide, losartan, nifedipine -certain medicines for cancer like cyclophosphamide, vinblastine, vincristine -certain medicines for cholesterol like atorvastatin, lovastatin, fluvastatin, simvastatin -certain medicines for depression, anxiety, or psychotic disturbances like amitriptyline, midazolam, nortriptyline, triazolam -certain medicines for diabetes like glipizide, glyburide, tolbutamide -certain medicines for pain like alfentanil, fentanyl, methadone -certain medicines for seizures like carbamazepine, phenytoin -certain medicines that treat or prevent blood clots like warfarin -halofantrine -medicines that lower your chance of fighting infection like cyclosporine, prednisone, tacrolimus -NSAIDS, medicines for pain and inflammation, like celecoxib, diclofenac, flurbiprofen, ibuprofen, meloxicam, naproxen -other medicines for fungal infections -sirolimus -theophylline -tofacitinib This list may not describe all possible interactions. Give your health care provider a list of all the medicines, herbs, non-prescription drugs, or dietary  supplements you use. Also tell them if you smoke, drink alcohol, or use illegal drugs. Some items may interact with your medicine. What should I watch for while using this medicine? Visit your doctor or health care professional for regular checkups. If you are taking this medicine for a long time you may need  blood work. Tell your doctor if your symptoms do not improve. Some fungal infections need many weeks or months of treatment to cure. Alcohol can increase possible damage to your liver. Avoid alcoholic drinks. If you have a vaginal infection, do not have sex until you have finished your treatment. You can wear a sanitary napkin. Do not use tampons. Wear freshly washed cotton, not synthetic, panties. What side effects may I notice from receiving this medicine? Side effects that you should report to your doctor or health care professional as soon as possible: -allergic reactions like skin rash or itching, hives, swelling of the lips, mouth, tongue, or throat -dark urine -feeling dizzy or faint -irregular heartbeat or chest pain -redness, blistering, peeling or loosening of the skin, including inside the mouth -trouble breathing -unusual bruising or bleeding -vomiting -yellowing of the eyes or skin Side effects that usually do not require medical attention (report to your doctor or health care professional if they continue or are bothersome): -changes in how food tastes -diarrhea -headache -stomach upset or nausea This list may not describe all possible side effects. Call your doctor for medical advice about side effects. You may report side effects to FDA at 1-800-FDA-1088. Where should I keep my medicine? Keep out of the reach of children. Store at room temperature below 30 degrees C (86 degrees F). Throw away any medicine after the expiration date. NOTE: This sheet is a summary. It may not cover all possible information. If you have questions about this medicine, talk to your doctor,  pharmacist, or health care provider.  2015, Elsevier/Gold Standard. (2012-12-17 16:13:04) Tinidazole tablets Qu es este medicamento? El TINIDAZOL es un medicamento antiinfeccioso. Se utiliza para tratar la amebiasis, giardiasis, tricomonosis y vaginosis. No es efectivo para resfros, gripe u otras infecciones de origen viral. Este medicamento puede ser utilizado para otros usos; si tiene alguna pregunta consulte con su proveedor de atencin mdica o con su farmacutico. MARCAS COMERCIALES DISPONIBLES: [place] Qu le debo informar a mi profesional de la salud antes de tomar este medicamento? Necesita saber si usted presenta alguno de los siguientes problemas o situaciones: -anemia u otros trastornos sanguneos -si consume bebidas alcohlicas con frecuencia -recibe hemodilisis -trastorno de convulsiones -una reaccin alrgica o inusual al tinidazol, a otros medicamentos, alimentos, colorantes o conservantes -si est embarazada o buscando quedar embarazada -si est amamantando a un beb Cmo debo utilizar este medicamento? Tome este medicamento por va oral con un vaso lleno de agua. Siga las instrucciones de la etiqueta del [place]. Tomar con alimentos. Tome sus dosis a intervalos regulares. No tome su medicamento con una frecuencia mayor a la indicada. Complete todas las dosis de su medicamento como se le haya indicado aun si se siente mejor. No omita ninguna dosis o suspenda el uso de su medicamento antes de lo indicado. Hable con su pediatra para informarse acerca del uso de este medicamento en nios. Aunque este medicamento ha sido recetado a nios tan menores como de 3 aos de edad para condiciones selectivas, las precauciones se aplican. Sobredosis: Pngase en contacto inmediatamente con un centro toxicolgico o una sala de urgencia si usted cree que haya tomado demasiado medicamento. ATENCIN: [employer] es solo para usted. No comparta este medicamento con nadie. Qu  sucede si me olvido de una dosis? Si olvida una dosis, tmela lo antes posible. Si es casi la hora de la prxima dosis, tome slo esa dosis. No tome dosis adicionales o dobles. Qu puede interactuar con este medicamento? No tome esta  medicina con ninguno de los siguientes medicamentos: -alcohol o cualquier producto que contenga alcohol -solucin oral de amprenavir -disulfiram -inyeccin de paclitaxel -solucin oral de ritonavir -solucin oral de sertralina -inyeccin de sulfametoxasol-trimetoprima Esta medicina tambin puede interactuar con los siguientes medicamentos: -colestiramina -cimetidina -conivaptn -ciclosporina -fluorouracilo -fosfenitona, fenitona -quetoconazol -litio -fenobarbital -tacrolimo -warfarina Puede ser que esta lista no menciona todas las posibles interacciones. Informe a su profesional de KB Home	[place] de [employer] productos a base de hierbas, medicamentos de [place] o suplementos nutritivos que est tomando. Si usted fuma, consume bebidas alcohlicas o si utiliza drogas ilegales, indqueselo tambin a su profesional de KB Home	[place]. Algunas sustancias pueden interactuar con su medicamento. A qu debo estar atento al usar Coca[employer]Cola? Si los sntomas no mejoran o si empeoran, consulte con su mdico o con su profesional de KB Home	[place]. Evite consumir las bebidas alcohlicas mientras toma este medicamento y durante tres das despus. El alcohol puede hacerle sentir mareado, enfermo o enrojecimiento. Si est recibiendo tratamiento para [place] enfermedad de transmisin sexual, evite todo contacto sexual hasta que haya terminado el [place]. Es posible que su pareja tambin necesite [place]. Qu efectos secundarios puedo tener al [employer] este medicamento? Efectos secundarios que debe informar a su mdico o a Barrister's clerk de la salud tan pronto como sea posible: -Chief of Staff como erupcin cutnea, picazn o urticarias, hinchazn de la cara,  labios o lengua -problemas respiratorios -confusin, depresin -manchas oscuras o blancas en la boca -sensacin de desmayos o mareos, cadas -fiebre, infeccin -entumecimiento, hormigueo, dolor o debilidad en las manos o pies -dolor al orinar -convulsiones -cansancio o debilidad inusual -irritacin o flujo vaginal -vmito Efectos secundarios que, por lo general, no requieren atencin mdica (debe informarlos a su mdico o a su profesional de la salud si persisten o si son molestos): -orina de color marrn oscuro o rojo -diarrea -dolor de cabeza -prdida del apetito -sabor metlico -nuseas -Higher education careers adviser Puede ser que esta lista no menciona todos los posibles efectos secundarios. Comunquese a su mdico por asesoramiento mdico [employer]. Usted puede informar los efectos secundarios a la FDA por telfono al 1-800-FDA-1088. Dnde debo guardar mi medicina? Mantngala fuera del alcance de los nios. Gurdela a [employer], entre 15 y 35 grados C (41 y 12 grados F). Protjala de la luz y de la humedad. Mantenga el envase bien cerrado. Deseche todo el medicamento que no haya utilizado, despus de la fecha de vencimiento. ATENCIN: Este folleto es un resumen. Puede ser que no cubra toda la posible informacin. Si usted tiene preguntas acerca de esta medicina, consulte con su mdico, su farmacutico o su profesional de Technical sales engineer.  2015, Elsevier/Gold Standard. (2006-12-19 16:39:00) Vaginitis monilisica (Monilial Vaginitis) La vaginitis es una inflamacin (irritacin, hinchazn) de la vagina y la vulva. Esta no es una enfermedad de transmisin sexual.  CAUSAS Este tipo de vaginitis lo causa un hongo (candida) que normalmente se encuentra en la vagina. El hongo candida se ha desarrollado hasta el punto de ocasionar problemas en el equilibrio qumico. SNTOMAS  Secrecin vaginal espesa y blanca.  Hinchazn, picazn, enrojecimiento e inflamacin de la  vagina y en algunos casos de los labios vaginales (vulva).  Ardor o dolor al [employer].  Dolor en [place]. DIAGNSTICO Los factores que favorecen la vaginitis moniliasica son:  Kyla Balzarine de virginidad y postmenopusicas.  Embarazo.  Infecciones.  Sentir cansancio, estar enferma o estresada, especialmente si ya ha sufrido este problema en el pasado.  Diabetes Buen control ayudar a  disminur la probabilidad.  Pldoras anticonceptivas  Ropa interior [place].  El uso de espumas de bao, aerosoles femeninos duchas vaginales o tampones con desodorante.  Algunos antibiticos (medicamentos que destruyen grmenes).  Si contrae alguna enfermedad puede sufrir recurrencias espordicas. [place] profesional que lo asiste prescribir medicamentos.  Hay diferentes tipos de cremas y supositorios vaginales que tratan especficamente la vaginitis monilisica. Para infecciones por hongos recurrentes, utilice un supositorio o crema en la vagina dos veces por semana, o segn se le indique.  Tambin podrn utilizarse cremas con corticoides o anti monilisicas para la picazn o la irritacin de la vulva. Consulte con el profesional que la asiste.  Si la crema no da resultado, podr aplicarse en la vagina una solucin con azul de metileno.  El consumo de yogur puede prevenir este tipo de vaginitis. INSTRUCCIONES [place] todos los medicamentos tal como se le indic.  No mantenga relaciones sexuales hasta que el tratamiento se haya completado, o segn las indicaciones del profesional que la asiste.  Tome baos de asiento tibios.  No se aplique duchas vaginales.  No utilice tampones, especialmente los perfumados.  Use ropa interior de algodn  [employer] pantalones ajustados y las medias tipo panty.  Comunique a sus compaeros sexuales que sufre una infeccin por hongos. Ellos deben concurrir para un control mdico si tienen sntomas como una  urticaria leve o picazn.  Sus compaeros sexuales deben tratarse tambin si la infeccin es difcil de Radiographer, therapeutic.  Practique el sexo seguro - use condones  Algunos medicamentos vaginales ocasionan fallas en los condones de ltex. Los medicamentos vaginales que pueden daar los condones son:  Building services engineer cleocina  Butoconazole (Femstat)  Terconazole (Terazol) supositorios vaginales  Miconazole (Monistat) (es un medicamento de venta libre) SOLICITE ATENCIN MDICA SI:  Waldron Session tiene una temperatura oral de ms de 38,9 C (102 F).  Si la infeccin empeora luego de 2 das de tratamiento.  Si la infeccin no mejora luego de 3 das de tratamiento.  Aparecen ampollas en o alrededor de la vagina.  Si aparece una hemorragia vaginal y no es el momento del perodo.  Siente dolor al [employer].  Presenta problemas intestinales.  Tiene dolor durante las [employer]. Document Released: 02/18/2005 Document Revised: 08/03/2011 [facility] Patient Information 2015 [place]. This information is not intended to replace advice given to you by your health care provider. Make sure you discuss any questions you have with your health care provider. Vaginosis bacteriana (Bacterial Vaginosis) La vaginosis bacteriana es una infeccin vaginal que perturba el equilibrio normal de las bacterias que se encuentran en la vagina. Es el resultado de un crecimiento excesivo de ciertas bacterias. Esta es la infeccin vaginal ms frecuente en mujeres en edad reproductiva. El tratamiento es importante para prevenir complicaciones, especialmente en mujeres embarazadas, dado que puede causar un parto prematuro. CAUSAS  La vaginosis bacteriana se origina por un aumento de bacterias nocivas que, generalmente, estn presentes en cantidades ms pequeas en la vagina. Varios tipos diferentes de bacterias pueden causar esta afeccin. Sin embargo, la causa de su desarrollo no se comprende totalmente. [place] o comportamientos pueden exponerlo a un mayor riesgo de desarrollar vaginosis bacteriana, entre los que se incluyen:  Tener una nueva pareja sexual o mltiples parejas sexuales.  Las duchas vaginales  El uso del DIU (dispositivo intrauterino) como mtodo anticonceptivo. El contagio no se produce en baos, por ropas de cama, en piscinas o por contacto con objetos. [place]  Algunas mujeres que padecen vaginosis bacteriana no presentan signos ni sntomas. Los sntomas ms comunes son:  Secrecin vaginal de color grisceo.  Secrecin vaginal con olor similar al [employer], especialmente despus de Retail banker.  Picazn o sensacin de ardor en la vagina o la vulva.  Ardor o dolor al [employer]. DIAGNSTICO  Su mdico analizar su historia clnica y le examinar la vagina para detectar signos de vaginosis bacteriana. Puede tomarle Truddie Coco de flujo vaginal. Su mdico examinar esta muestra con un microscopio para controlar las bacterias y clulas anormales. Tambin puede realizarse un anlisis del pH vaginal.  TRATAMIENTO  La vaginosis bacteriana puede tratarse con antibiticos, en forma de comprimidos o de crema vaginal. Puede indicarse una segunda tanda de antibiticos si la afeccin se repite despus del tratamiento.  [place] solo medicamentos de venta libre o recetados, segn las indicaciones del mdico.  Si le han recetado antibiticos, tmelos como se le indic. Asegrese de que finaliza la prescripcin completa aunque se sienta mejor.  No mantenga relaciones sexuales [employer].  Comunique a sus compaeros sexuales que sufre una infeccin vaginal. Deben consultar a su mdico y recibir tratamiento si tienen problemas, como picazn o una erupcin cutnea leve.  Practique el sexo seguro usando preservativos y tenga un nico compaero sexual. SOLICITE ATENCIN MDICA SI:    Sus sntomas no mejoran despus de 3 das de [place].  Aumenta la secrecin o Conservation officer, historic buildings.  Tiene fiebre. ASEGRESE DE QUE:   Comprende estas instrucciones.  Controlar su afeccin.  Recibir ayuda de inmediato si no mejora o si empeora. PARA OBTENER MS INFORMACIN  Centros para el control y la prevencin de Probation officer for Disease Control and Prevention, CDC): AppraiserFraud.fi Asociacin Estadounidense de la Salud Sexual (American Sexual Health Association, SHA): www.ashastd.org  Document Released: 08/18/2007 Document Revised: 03/01/2013 [facility] Patient Information 2015 [place], [place]. This information is not intended to replace advice given to you by your health care provider. Make sure you discuss any questions you have with your health care provider.

## 2014-05-07 NOTE — Addendum Note (Signed)
Addended by: Berna SpareASTILLO, Alani Sabbagh A on: 05/07/2014 10:33 AM   Modules accepted: Orders, SmartSet

## 2014-05-07 NOTE — Progress Notes (Signed)
Beth LawsYuli Tadeo-Bravo [birth year] [id number]   History:    38 y.o.  for annual gyn exam who was complaining of vaginal discharge for the past few days. No vulvar pruritus reported. Patient also has been complaining at times of headaches. She is being followed by her PCP Dr. who is treating her for her hypercholesterolemia. Patient also has been followed by[facility]. Patient is also been followed by   [facility] for her seizure disorder which she sees every 6 months. She takes Neurontin 600 mg daily and is also on Keppra 500 mg daily as well. She takes Proventil inhaler when necessary for her asthma. Patient's last mammogram was in 2012 was normal only fatty tissue noted in the area of concern of the left breast as described above. She frequently does her self breast examination. Patient's vaccines are up-to-date. Patient with no prior history of abnormal Pap smears. Patient 2014 had been placed on oxybutynin 10 mg daily for her overactive bladder which has improved her symptoms considerably. Patient with history of abdominal hysterectomy in 2007. Patient's also has had history of laparoscopic lysis of pelvic adhesions.   Past medical history,surgical history, family history and social history were all reviewed and documented in the EPIC chart.  Gynecologic History No LMP recorded. Patient has had a hysterectomy. Contraception: status post hysterectomy Last Pap:  2012ults were: normal Last mammogram:  2012ults were: normal  Obstetric History OB History  Gravida Para Term Preterm AB SAB TAB Ectopic Multiple Living  2 2            # Outcome Date GA Lbr Len/2nd Weight Sex Delivery Anes PTL Lv  2 Para      CS-LTranv     1 Para      CS-LTranv          ROS: A ROS was performed and pertinent positives and negatives are included in the history.  GENERAL: No fevers or chills. HEENT: No change in vision, no earache, sore throat or sinus congestion. NECK: No pain or stiffness. CARDIOVASCULAR: No  chest pain or pressure. No palpitations. PULMONARY: No shortness of breath, cough or wheeze. GASTROINTESTINAL: No abdominal pain, nausea, vomiting or diarrhea, melena or bright red blood per rectum. GENITOURINARY: No urinary frequency, urgency, hesitancy or dysuria. MUSCULOSKELETAL: No joint or muscle pain, no back pain, no recent trauma. DERMATOLOGIC: No rash, no itching, no lesions. ENDOCRINE: No polyuria, polydipsia, no heat or cold intolerance. No recent change in weight. HEMATOLOGICAL: No anemia or easy bruising or bleeding. NEUROLOGIC: No headache, seizures, numbness, tingling or weakness. PSYCHIATRIC: No depression, no loss of interest in normal activity or change in sleep pattern.     Exam: chaperone present  BP 116/70 mmHg  Ht 5' 2.5" (1.588 m)  Wt 162 lb (73.483 kg)  BMI 29.14 kg/m2  Body mass index is 29.14 kg/(m^2).  General appearance : Well developed well nourished female. No acute distress HEENT: Neck supple, trachea midline, no carotid bruits, no thyroidmegaly Lungs: Clear to auscultation, no rhonchi or wheezes, or rib retractions  Heart: Regular rate and rhythm, no murmurs or gallops Breast:Examined in sitting and supine position were symmetrical in appearance, no palpable masses or tenderness,  no skin retraction, no nipple inversion, no nipple discharge, no skin discoloration, no axillary or supraclavicular lymphadenopathy Abdomen: no palpable masses or tenderness, no rebound or guarding Extremities: no edema or skin discoloration or tenderness  Pelvic:  Bartholin, Urethra, Skene Glands: Within normal limits  Vagina: White discharge Cervix:absent             Uterus: Absent   Adnexa  Without masses or tenderness  Anus and perineum  normal   Rectovaginal  normal sphincter tone without palpated masses or tenderness             Hemoccult not indicated    wet prep: Few clue cells, few WBC, too numerous to count bacteria and moderate yeast   Assessment/Plan:   38 y.o. female for annual examwith clinical evidence of bacterial vaginosis and monilial bacterial vaginitis. Patient will be treated with Tindamax 500 mg to take 4 tablets today and repeat in 24 hours. On the third day she will take a Diflucan 150 mg one by mouth. Patient will follow up with her PCP and her neurologist in reference to her headaches. As a process of elimination will tell her to stop her oxybutynin to see if this is the medication is causing the headaches as a side effect or perhaps it could be the medications for her seizure or her cholesterol which she will discuss with them as well. She will also be getting her blood work to include a fasting lipid profile within the next few weeks with her PCP. We discussed importance of calcium and vitamin D and regular exercise for osteoporosis prevention. She had several years ago vitamin D deficiency so we will be checking a vitamin D level today. Pap smears are no longer needed in accordance to the new guidelines.    Ok EdwardsFERNANDEZ,JUAN H MD, 9:13 AM 05/07/2014

## 2014-05-08 ENCOUNTER — Encounter: Payer: Self-pay | Admitting: Gynecology

## 2014-05-08 LAB — VITAMIN D 25 HYDROXY (VIT D DEFICIENCY, FRACTURES): Vit D, 25-Hydroxy: 15 ng/mL — ABNORMAL LOW (ref 30–100)

## 2014-05-16 ENCOUNTER — Other Ambulatory Visit: Payer: Self-pay | Admitting: Gynecology

## 2014-05-23 ENCOUNTER — Other Ambulatory Visit: Payer: Self-pay

## 2014-05-23 ENCOUNTER — Other Ambulatory Visit: Payer: Self-pay | Admitting: Gynecology

## 2014-05-23 DIAGNOSIS — E559 Vitamin D deficiency, unspecified: Secondary | ICD-10-CM

## 2014-05-23 MED ORDER — VITAMIN D (ERGOCALCIFEROL) 1.25 MG (50000 UNIT) PO CAPS: 50000.0000 [IU] | ORAL_CAPSULE | ORAL | Status: DC

## 2014-05-23 MED ORDER — OXYBUTYNIN CHLORIDE ER 10 MG PO TB24: 10.0000 mg | ORAL_TABLET | Freq: Every day | ORAL | Status: DC

## 2014-05-23 NOTE — Telephone Encounter (Signed)
Meant to get you to refill at [facility] two weeks ago.

## 2014-06-02 ENCOUNTER — Other Ambulatory Visit: Payer: Self-pay | Admitting: Internal Medicine

## 2014-06-07 ENCOUNTER — Ambulatory Visit (INDEPENDENT_AMBULATORY_CARE_PROVIDER_SITE_OTHER): Payer: BLUE CROSS/BLUE SHIELD | Admitting: Internal Medicine

## 2014-06-07 ENCOUNTER — Encounter: Payer: Self-pay | Admitting: Internal Medicine

## 2014-06-07 ENCOUNTER — Ambulatory Visit (HOSPITAL_BASED_OUTPATIENT_CLINIC_OR_DEPARTMENT_OTHER)
Admission: RE | Admit: 2014-06-07 | Discharge: 2014-06-07 | Disposition: A | Discharge status: Home / Self Care | Payer: BLUE CROSS/BLUE SHIELD | Source: Ambulatory Visit | Attending: Internal Medicine | Admitting: Internal Medicine

## 2014-06-07 VITALS — BP 108/74 | HR 74 | Temp 98.1°F | Ht 63.0 in | Wt 164.5 lb

## 2014-06-07 DIAGNOSIS — Z23 Encounter for immunization: Secondary | ICD-10-CM

## 2014-06-07 DIAGNOSIS — R110 Nausea: Secondary | ICD-10-CM | POA: Diagnosis not present

## 2014-06-07 DIAGNOSIS — R519 Headache, unspecified: Secondary | ICD-10-CM

## 2014-06-07 DIAGNOSIS — R51 Headache: Secondary | ICD-10-CM | POA: Insufficient documentation

## 2014-06-07 DIAGNOSIS — Z0000 Encounter for general adult medical examination without abnormal findings: Secondary | ICD-10-CM

## 2014-06-07 LAB — CBC WITH DIFFERENTIAL/PLATELET
BASOS ABS: 0 10*3/uL (ref 0.0–0.1)
BASOS PCT: 0.5 % (ref 0.0–3.0)
Eosinophils Absolute: 0 10*3/uL (ref 0.0–0.7)
Eosinophils Relative: 0.6 % (ref 0.0–5.0)
HCT: 39.5 % (ref 36.0–46.0)
Hemoglobin: 13.2 g/dL (ref 12.0–15.0)
Lymphocytes Relative: 35.7 % (ref 12.0–46.0)
Lymphs Abs: 2.1 10*3/uL (ref 0.7–4.0)
MCHC: 33.3 g/dL (ref 30.0–36.0)
MCV: 91 fl (ref 78.0–100.0)
Monocytes Absolute: 0.2 10*3/uL (ref 0.1–1.0)
Monocytes Relative: 3.2 % (ref 3.0–12.0)
Neutro Abs: 3.5 10*3/uL (ref 1.4–7.7)
Neutrophils Relative %: 60 % (ref 43.0–77.0)
Platelets: 219 10*3/uL (ref 150.0–400.0)
RBC: 4.34 Mil/uL (ref 3.87–5.11)
RDW: 12.5 % (ref 11.5–15.5)
WBC: 5.8 10*3/uL (ref 4.0–10.5)

## 2014-06-07 LAB — LIPID PANEL
Cholesterol: 183 mg/dL (ref 0–200)
HDL: 41.6 mg/dL (ref >39.00)
LDL Cholesterol: 109 mg/dL — ABNORMAL HIGH (ref 0–99)
NONHDL: 141.4
Total CHOL/HDL Ratio: 4
Triglycerides: 163 mg/dL — ABNORMAL HIGH (ref 0.0–149.0)
VLDL: 32.6 mg/dL (ref 0.0–40.0)

## 2014-06-07 LAB — COMPREHENSIVE METABOLIC PANEL
ALT: 43 U/L — ABNORMAL HIGH (ref 0–35)
AST: 24 U/L (ref 0–37)
Albumin: 4.6 g/dL (ref 3.5–5.2)
Alkaline Phosphatase: 61 U/L (ref 39–117)
BILIRUBIN TOTAL: 0.4 mg/dL (ref 0.2–1.2)
BUN: 10 mg/dL (ref 6–23)
CALCIUM: 9.3 mg/dL (ref 8.4–10.5)
CHLORIDE: 107 meq/L (ref 96–112)
CO2: 27 meq/L (ref 19–32)
Creatinine, Ser: 0.58 mg/dL (ref 0.40–1.20)
GFR: 123.19 mL/min (ref >60.00)
Glucose, Bld: 97 mg/dL (ref 70–99)
Potassium: 4 mEq/L (ref 3.5–5.1)
SODIUM: 138 meq/L (ref 135–145)
TOTAL PROTEIN: 7.6 g/dL (ref 6.0–8.3)

## 2014-06-07 NOTE — Progress Notes (Signed)
Pre visit review using our clinic review tool, if applicable. No additional management support is needed unless otherwise documented below in the visit note. 

## 2014-06-07 NOTE — Progress Notes (Signed)
Subjective:    Patient ID: Beth Blackwell, female    DOB: [birth year], 39 y.o.   MRN: [id number]  DOS:  06/07/2014 Type of visit - description : cpx Interval history: In general feeling well, 2 months ago had an episode of headache, moderate in intensity, at the top and posterior aspect of the head, daily. That was unusual for her, since then the headache has decrease. When asked admits to mild photo and phonophobia w/ the HA. Few days had nausea. Gynecology recommended to hold oxybutynin but that didn't make a difference. Wonders if Pravachol is the culprit.   ROS Denies fever, chills. No sinus pain or congestion No chest pain or difficulty breathing No nausea, vomiting, diarrhea No cough, sputum production or wheezing. Some distress, at baseline. No anxiety per se No dizziness  Past Medical History  Diagnosis Date  . Depression   . Sickle cell trait   . Seizures     no seizures  since ~ 2012  . High cholesterol   . Retained orthopedic hardware 02/2014    right wrist/forearm  . Seasonal allergies   . Vitamin D deficiency     Past Surgical History  Procedure Laterality Date  . Appendectomy    . Cesarean section      x 2  . Wrist arthroscopy with ulna shortening Right 06/25/2010    and TFCC debridement, shrinkage scapholunate ligament  . Tubal ligation  02/06/2000  . Abdominal hysterectomy  12/28/2005  . Ovarian cyst removal Right 09/22/2006  . Laparoscopic lysis of adhesions  09/22/2006    abdominopelvic  . Hardware removal Right 03/09/2014    Procedure: REMOVAL PLATE AND SCREW RIGHT ULNAR;  Surgeon: Cindee SaltGary Kuzma, MD;  Location: [facility];  Service: Orthopedics;  Laterality: Right;    History   Social History  . Marital Status: Married    Spouse Name: N/A    Number of Children: 2  . Years of Education: N/A   Occupational History  . 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 Smoking status: Never Smoker   . Smokeless  tobacco: Never Used  . Alcohol Use: No  . Drug Use: No  . Sexual Activity: Yes    Birth Control/ Protection: Surgical   Other Topics Concern  . Not on file   Social History Narrative   Original from [place]   Household-- pt, husband, 2 children (8819 and 39 y/o)     Family History  Problem Relation Age of Onset  . Diabetes Mother   . Hypertension Mother   . Diabetes Father   . Hypertension Father   . Hypertension Sister   . Seizures Neg Hx   . Colon cancer Neg Hx   . Breast cancer Neg Hx        Medication List       This list is accurate as of: 06/07/14  1:37 PM.  Always use your most recent med list.               albuterol 108 (90 BASE) MCG/ACT inhaler  Commonly known as:  VENTOLIN HFA  Inhale 2 puffs into the lungs every 6 (six) hours as needed for wheezing or shortness of breath.     CALTRATE 600+D 600-400 MG-UNIT per tablet  Generic drug:  Calcium Carbonate-Vitamin D  Take 1 tablet by mouth daily.     cetirizine 10 MG tablet  Commonly known as:  ZYRTEC  Take 10 mg by mouth daily.  fluticasone 50 MCG/ACT nasal spray  Commonly known as:  FLONASE  Place 2 sprays into both nostrils daily.     gabapentin 600 MG tablet  Commonly known as:  NEURONTIN  Take 1 tablet (600 mg total) by mouth 3 (three) times daily.     levETIRAcetam 500 MG tablet  Commonly known as:  KEPPRA  Take 2 tablets (1,000 mg total) by mouth 2 (two) times daily.     Omega 3 1000 MG Caps  Take by mouth.     oxybutynin 10 MG 24 hr tablet  Commonly known as:  DITROPAN-XL  Take 1 tablet (10 mg total) by mouth at bedtime.     pravastatin 20 MG tablet  Commonly known as:  PRAVACHOL  TAKE 1 TABLET BY MOUTH EVERY DAY     Vitamin D (Ergocalciferol) 50000 UNITS Caps capsule  Commonly known as:  DRISDOL  Take 1 capsule (50,000 Units total) by mouth every 7 (seven) days.           Objective:   Physical Exam BP 108/74 mmHg  Pulse 74  Temp(Src) 98.1 F (36.7 C) (Oral)   Ht  (1.6 m)  Wt 164 lb 8 oz (74.617 kg)  BMI 29.15 kg/m2  SpO2 100%  General -- alert, well-developed, NAD.  Neck --no thyromegaly  , supple HEENT-- Not pale.  Lungs -- normal respiratory effort, no intercostal retractions, no accessory muscle use, and normal breath sounds.  Heart-- normal rate, regular rhythm, no murmur.  Abdomen-- Not distended, good bowel sounds,soft, non-tender. Extremities-- no pretibial edema bilaterally  Neurologic--  alert & oriented X3. Speech normal, gait appropriate for age, strength symmetric and appropriate for age.  EOMI, PERLA   Psych-- Cognition and judgment appear intact. Cooperative with normal attention span and concentration. No anxious or depressed appearing.       Assessment & Plan:

## 2014-06-07 NOTE — Assessment & Plan Note (Addendum)
Td 2015 Flu shot today Female care per gynecology Diet and exercise discussed  Other issues: Allergies, asthma: Well controlled on as needed medications High cholesterol, on Pravachol, check labs Episode of headache, was unusual for her, better but not gone. When asked, admits that he was the worst of her life. Because the photo and phonophobia, it did sound like a migraine however other consideration is ICB. Plan: OTC Motrin, CT head. Vitamin D, on supplements per gynecology  Return to clinic 6 months

## 2014-06-07 NOTE — Patient Instructions (Signed)
Get your blood work before you leave   Will do a CT head  For headache  IBUPROFEN (Advil or Motrin) 200 mg 2 tablets every 6 hours as needed for pain.  Always take it with food because may cause gastritis and ulcers.  If you notice nausea, stomach pain, change in the color of stools --->  Stop the medicine and let us know  Please come back to the office in 6 months  for a routine check up

## 2014-08-16 ENCOUNTER — Other Ambulatory Visit: Payer: BLUE CROSS/BLUE SHIELD

## 2014-08-16 DIAGNOSIS — E559 Vitamin D deficiency, unspecified: Secondary | ICD-10-CM

## 2014-08-17 LAB — VITAMIN D 25 HYDROXY (VIT D DEFICIENCY, FRACTURES): VIT D 25 HYDROXY: 19 ng/mL — AB (ref 30–100)

## 2014-09-10 ENCOUNTER — Other Ambulatory Visit: Payer: Self-pay | Admitting: Gynecology

## 2014-09-10 DIAGNOSIS — E559 Vitamin D deficiency, unspecified: Secondary | ICD-10-CM

## 2014-09-10 MED ORDER — VITAMIN D (ERGOCALCIFEROL) 1.25 MG (50000 UNIT) PO CAPS: 50000.0000 [IU] | ORAL_CAPSULE | ORAL | Status: DC

## 2014-09-20 ENCOUNTER — Other Ambulatory Visit: Payer: Self-pay

## 2014-10-28 ENCOUNTER — Other Ambulatory Visit: Payer: Self-pay | Admitting: Internal Medicine

## 2014-12-05 ENCOUNTER — Other Ambulatory Visit: Payer: Self-pay

## 2014-12-05 ENCOUNTER — Other Ambulatory Visit: Payer: Self-pay | Admitting: Gynecology

## 2014-12-05 MED ORDER — VITAMIN D (ERGOCALCIFEROL) 1.25 MG (50000 UNIT) PO CAPS: ORAL_CAPSULE | ORAL | Status: DC

## 2014-12-06 ENCOUNTER — Other Ambulatory Visit: Payer: BLUE CROSS/BLUE SHIELD

## 2014-12-06 ENCOUNTER — Ambulatory Visit (INDEPENDENT_AMBULATORY_CARE_PROVIDER_SITE_OTHER): Payer: BLUE CROSS/BLUE SHIELD | Admitting: Internal Medicine

## 2014-12-06 ENCOUNTER — Encounter: Payer: Self-pay | Admitting: Internal Medicine

## 2014-12-06 VITALS — BP 108/66 | HR 59 | Temp 98.2°F | Ht 63.0 in | Wt 160.2 lb

## 2014-12-06 DIAGNOSIS — G40909 Epilepsy, unspecified, not intractable, without status epilepticus: Secondary | ICD-10-CM

## 2014-12-06 DIAGNOSIS — E559 Vitamin D deficiency, unspecified: Secondary | ICD-10-CM

## 2014-12-06 DIAGNOSIS — R7989 Other specified abnormal findings of blood chemistry: Secondary | ICD-10-CM

## 2014-12-06 DIAGNOSIS — R945 Abnormal results of liver function studies: Secondary | ICD-10-CM

## 2014-12-06 DIAGNOSIS — E785 Hyperlipidemia, unspecified: Secondary | ICD-10-CM

## 2014-12-06 LAB — AST: AST: 23 U/L (ref 0–37)

## 2014-12-06 LAB — ALT: ALT: 32 U/L (ref 0–35)

## 2014-12-06 MED ORDER — PRAVASTATIN SODIUM 20 MG PO TABS: 20.0000 mg | ORAL_TABLET | Freq: Every day | ORAL | Status: DC

## 2014-12-06 NOTE — Progress Notes (Signed)
Subjective:    Patient ID: Beth Blackwell, female    DOB: [birth year], 39 y.o.   MRN: [id number]  DOS:  12/06/2014 Type of visit - description : Routine office visit Interval history: In general feeling well, she is somewhat concerned about episodes of dizziness have been every 4-6 months for the last few years. She becomes slightly dizzy, nauseous, has mild to moderate headache and start feeling diaphoretic. Sometimes she hyperventilated. 2 or 3 of those episodes have been associated with feeling scared (like when she saw her scar from her surgery). Episodes last 4-5 minutes, no syncope. No associated seizure activity.  Good compliance of medication Labs reviewed, due for LFTs  Review of Systems Denies chest pain, lower extremity edema No anxiety or depression  Past Medical History  Diagnosis Date  . Depression   . Sickle cell trait   . Seizures     no seizures  since ~ 2012  . High cholesterol   . Retained orthopedic hardware 02/2014    right wrist/forearm  . Seasonal allergies   . Vitamin D deficiency     Past Surgical History  Procedure Laterality Date  . Appendectomy    . Cesarean section      x 2  . Wrist arthroscopy with ulna shortening Right 06/25/2010    and TFCC debridement, shrinkage scapholunate ligament  . Tubal ligation  02/06/2000  . Abdominal hysterectomy  12/28/2005  . Ovarian cyst removal Right 09/22/2006  . Laparoscopic lysis of adhesions  09/22/2006    abdominopelvic  . Hardware removal Right 03/09/2014    Procedure: REMOVAL PLATE AND SCREW RIGHT ULNAR;  Surgeon: Cindee Salt, MD;  Location: [facility];  Service: Orthopedics;  Laterality: Right;    History   Social History  . Marital Status: Married    Spouse Name: N/A  . Number of Children: 2  . Years of Education: N/A   Occupational History  . housekeeping Counselling psychologist   Social History Main Topics  . Smoking status: Never Smoker   . Smokeless tobacco: Never  Used  . Alcohol Use: No  . Drug Use: No  . Sexual Activity: Yes    Birth Control/ Protection: Surgical   Other Topics Concern  . Not on file   Social History Narrative   Original from [place]   Household-- pt, husband, 2 children (44 and 33 y/o)        Medication List       This list is accurate as of: 12/06/14  2:06 PM.  Always use your most recent med list.               albuterol 108 (90 BASE) MCG/ACT inhaler  Commonly known as:  VENTOLIN HFA  Inhale 2 puffs into the lungs every 6 (six) hours as needed for wheezing or shortness of breath.     CALTRATE 600+D 600-400 MG-UNIT per tablet  Generic drug:  Calcium Carbonate-Vitamin D  Take 1 tablet by mouth daily.     cetirizine 10 MG tablet  Commonly known as:  ZYRTEC  Take 10 mg by mouth daily.     fluticasone 50 MCG/ACT nasal spray  Commonly known as:  FLONASE  Place 2 sprays into both nostrils daily.     gabapentin 600 MG tablet  Commonly known as:  NEURONTIN  Take 1 tablet (600 mg total) by mouth 3 (three) times daily.     levETIRAcetam 500 MG tablet  Commonly known as:  KEPPRA  Take  2 tablets (1,000 mg total) by mouth 2 (two) times daily.     Omega 3 1000 MG Caps  Take by mouth.     oxybutynin 10 MG 24 hr tablet  Commonly known as:  DITROPAN-XL  Take 1 tablet (10 mg total) by mouth at bedtime.     pravastatin 20 MG tablet  Commonly known as:  PRAVACHOL  Take 1 tablet (20 mg total) by mouth daily.           Objective:   Physical Exam BP 108/66 mmHg  Pulse 59  Temp(Src) 98.2 F (36.8 C) (Oral)  Ht  (1.6 m)  Wt 160 lb 4 oz (72.689 kg)  BMI 28.39 kg/m2  SpO2 98%  General:   Well developed, well nourished . NAD.  HEENT:  Normocephalic . Face symmetric, atraumatic Lungs:  CTA B Normal respiratory effort, no intercostal retractions, no accessory muscle use. Heart: RRR,  no murmur.  No pretibial edema bilaterally  Skin: Not pale. Not jaundice Neurologic:  alert & oriented  X3.  Speech normal, gait appropriate for age and unassisted Motor symmetric. Psych--  Cognition and judgment appear intact.  Cooperative with normal attention span and concentration.  Behavior appropriate. No anxious or depressed appearing.       Assessment & Plan:    High cholesterol, refill Pravachol, check LFTs.  Seizure disorder: On medication, no sz activity  Vitamin D deficiency, managed by gynecology. Status post gurgle calciferol  Also complained of gradual decrease in libido. Recommend to discuss with gynecology, early menopause? She wonders if is a side effect of the medication.  Vasovagal episodes? Having dizziness, headaches, nausea, diaphoresis on and off as described above. Vasovagal episodes? No associated severe headaches or seizures. Recommend observation for now

## 2014-12-06 NOTE — Progress Notes (Signed)
Pre visit review using our clinic review tool, if applicable. No additional management support is needed unless otherwise documented below in the visit note. 

## 2014-12-06 NOTE — Patient Instructions (Signed)
Get your blood work before you leave    

## 2014-12-07 LAB — VITAMIN D 25 HYDROXY (VIT D DEFICIENCY, FRACTURES): Vit D, 25-Hydroxy: 23 ng/mL — ABNORMAL LOW (ref 30–100)

## 2014-12-12 ENCOUNTER — Other Ambulatory Visit: Payer: Self-pay | Admitting: Gynecology

## 2014-12-12 DIAGNOSIS — E559 Vitamin D deficiency, unspecified: Secondary | ICD-10-CM

## 2014-12-12 MED ORDER — VITAMIN D (ERGOCALCIFEROL) 1.25 MG (50000 UNIT) PO CAPS: 50000.0000 [IU] | ORAL_CAPSULE | ORAL | Status: DC

## 2015-04-15 ENCOUNTER — Ambulatory Visit (INDEPENDENT_AMBULATORY_CARE_PROVIDER_SITE_OTHER): Payer: BLUE CROSS/BLUE SHIELD | Admitting: Nurse Practitioner

## 2015-04-15 ENCOUNTER — Encounter: Payer: Self-pay | Admitting: Nurse Practitioner

## 2015-04-15 ENCOUNTER — Other Ambulatory Visit: Payer: Self-pay | Admitting: *Deleted

## 2015-04-15 VITALS — BP 118/68 | HR 60 | Ht 63.0 in | Wt 163.8 lb

## 2015-04-15 DIAGNOSIS — G40209 Localization-related (focal) (partial) symptomatic epilepsy and epileptic syndromes with complex partial seizures, not intractable, without status epilepticus: Secondary | ICD-10-CM | POA: Diagnosis not present

## 2015-04-15 MED ORDER — LEVETIRACETAM 500 MG PO TABS: 1000.0000 mg | ORAL_TABLET | Freq: Two times a day (BID) | ORAL | Status: DC

## 2015-04-15 MED ORDER — GABAPENTIN 600 MG PO TABS: 600.0000 mg | ORAL_TABLET | Freq: Three times a day (TID) | ORAL | Status: DC

## 2015-04-15 MED ORDER — OXYBUTYNIN CHLORIDE ER 10 MG PO TB24: 10.0000 mg | ORAL_TABLET | Freq: Every day | ORAL | Status: DC

## 2015-04-15 NOTE — Progress Notes (Signed)
I have read the note, and I agree with the clinical assessment and plan.  Mavrik Bynum KEITH   

## 2015-04-15 NOTE — Progress Notes (Signed)
[facility]  PATIENT: Beth Blackwell DOB: 02/10/1976   REASON FOR VISIT: Follow-up for seizure disorder HISTORY FROM: Patient and interpreter    HISTORY OF PRESENT ILLNESS:[name] is a 39 year old right-handed Hispanic female with a history of nocturnal seizure events. She has very limited [place] and is here with her interpreter. The patient also has infrequent episodes of brief tremors affecting the right hand and arm. This has been going on for  years. The patient has had 2 generalized seizures at nighttime, but she has not had any events since 2011. She is being treated with Keppra and gabapentin, and she is tolerating the medications well. The patient does operate a motor vehicle without difficulty. She returns to this office for an evaluation. She reports no other new medical issues that have come up since last seen.   REVIEW OF SYSTEMS: Full 14 system review of systems performed and notable only for those listed, all others are neg:  Constitutional: Fatigue Cardiovascular: neg Ear/Nose/Throat: neg  Skin: neg Eyes: neg Respiratory: neg Gastroitestinal: neg  Hematology/Lymphatic: neg  Endocrine: neg Musculoskeletal:neg Allergy/Immunology: neg Neurological: Seizure disorder Psychiatric: neg Sleep : Insomnia ALLERGIES: Allergies  Allergen Reactions  . Acetaminophen Shortness Of Breath    HOME MEDICATIONS: Outpatient Prescriptions Prior to Visit  Medication Sig Dispense Refill  . albuterol (VENTOLIN HFA) 108 (90 BASE) MCG/ACT inhaler Inhale 2 puffs into the lungs every 6 (six) hours as needed for wheezing or shortness of breath. 1 Inhaler 1  . Calcium Carbonate-Vitamin D (CALTRATE 600+D) 600-400 MG-UNIT per tablet Take 1 tablet by mouth daily.      . cetirizine (ZYRTEC) 10 MG tablet Take 10 mg by mouth daily.    . fluticasone (FLONASE) 50 MCG/ACT nasal spray Place 2 sprays into both nostrils daily. 16 g 6  . gabapentin (NEURONTIN) 600 MG  tablet Take 1 tablet (600 mg total) by mouth 3 (three) times daily. 270 tablet 3  . levETIRAcetam (KEPPRA) 500 MG tablet Take 2 tablets (1,000 mg total) by mouth 2 (two) times daily. 360 tablet 3  . OMEGA 3 1000 MG CAPS Take by mouth.      . oxybutynin (DITROPAN-XL) 10 MG 24 hr tablet Take 1 tablet (10 mg total) by mouth at bedtime. 30 tablet 11  . pravastatin (PRAVACHOL) 20 MG tablet Take 1 tablet (20 mg total) by mouth daily. 30 tablet 8  . Vitamin D, Ergocalciferol, (DRISDOL) 50000 UNITS CAPS capsule Take 1 capsule (50,000 Units total) by mouth every 7 (seven) days. 12 capsule 0   No facility-administered medications prior to visit.    PAST MEDICAL HISTORY: Past Medical History  Diagnosis Date  . Depression   . Sickle cell trait ([place])   . Seizures ([place])     no seizures  since ~ 2012  . High cholesterol   . Retained orthopedic hardware 02/2014    right wrist/forearm  . Seasonal allergies   . Vitamin D deficiency     PAST SURGICAL HISTORY: Past Surgical History  Procedure Laterality Date  . Appendectomy    . Cesarean section      x 2  . Wrist arthroscopy with ulna shortening Right 06/25/2010    and TFCC debridement, shrinkage scapholunate ligament  . Tubal ligation  02/06/2000  . Abdominal hysterectomy  12/28/2005  . Ovarian cyst removal Right 09/22/2006  . Laparoscopic lysis of adhesions  09/22/2006    abdominopelvic  . Hardware removal Right 03/09/2014    Procedure: REMOVAL PLATE AND SCREW RIGHT ULNAR;  Surgeon: Daryll Brod, MD;  Location: [facility];  Service: Orthopedics;  Laterality: Right;    FAMILY HISTORY: Family History  Problem Relation Age of Onset  . Diabetes Mother   . Hypertension Mother   . Diabetes Father   . Hypertension Father   . Hypertension Sister   . Seizures Neg Hx   . Colon cancer Neg Hx   . Breast cancer Neg Hx     SOCIAL HISTORY: Social History   Social History  . Marital Status: Married    Spouse Name: N/A  . Number of  Children: 2  . Years of Education: N/A   Occupational History  . 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 Smoking status: Never Smoker   . Smokeless tobacco: Never Used  . Alcohol Use: No  . Drug Use: No  . Sexual Activity: Yes    Birth Control/ Protection: Surgical   Other Topics Concern  . Not on file   Social History Narrative   Original from [place], [place]   Household-- pt, husband, 2 children (86 and 23 y/o)     PHYSICAL EXAM  Filed Vitals:   04/15/15 1116  BP: 118/68  Pulse: 60  Height: '5\' 3"'  (1.6 m)  Weight: 163 lb 12.8 oz (74.299 kg)   Body mass index is 29.02 kg/(m^2). General: The patient is alert and cooperative at the time of the examination. The patient is moderately obese. Skin: No significant peripheral edema is noted.  Neurologic Exam Mental status: The patient is oriented x 3. Cranial nerves: Facial symmetry is present. Speech is normal, no aphasia or dysarthria is noted. Extraocular movements are full. Visual fields are full. Motor: The patient has good strength in all 4 extremities. Sensory examination: Soft touch sensation on the face and arms are symmetric. Coordination: The patient has good finger-nose-finger and heel-to-shin bilaterally. Gait and station: The patient has a normal gait. Tandem gait is normal. Romberg is negative. No drift is seen. Reflexes: Deep tendon reflexes are symmetric upper and lower.     DIAGNOSTIC DATA (LABS, IMAGING, TESTING) - I reviewed patient records, labs, notes, testing and imaging myself where available.  Lab Results  Component Value Date   WBC 5.8 06/07/2014   HGB 13.2 06/07/2014   HCT 39.5 06/07/2014   MCV 91.0 06/07/2014   PLT 219.0 06/07/2014      Component Value Date/Time   NA 138 06/07/2014 0945   K 4.0 06/07/2014 0945   CL 107 06/07/2014 0945   CO2 27 06/07/2014 0945   GLUCOSE 97 06/07/2014 0945   BUN 10 06/07/2014 0945   CREATININE 0.58 06/07/2014 0945    CALCIUM 9.3 06/07/2014 0945   PROT 7.6 06/07/2014 0945   ALBUMIN 4.6 06/07/2014 0945   AST 23 12/06/2014 0858   ALT 32 12/06/2014 0858   ALKPHOS 61 06/07/2014 0945   BILITOT 0.4 06/07/2014 0945   GFRNONAA >60 09/21/2009 0755   GFRAA  09/21/2009 0755    >60        The eGFR has been calculated using the MDRD equation. This calculation has not been validated in all clinical situations. eGFR's persistently <60 mL/min signify possible Chronic Kidney Disease.   Lab Results  Component Value Date   CHOL 183 06/07/2014   HDL 41.60 06/07/2014   LDLCALC 109* 06/07/2014   LDLDIRECT 104.8 02/21/2013   TRIG 163.0* 06/07/2014   CHOLHDL 4 06/07/2014       ASSESSMENT AND PLAN  39 y.o.  year old female  has a past medical history of  Seizures ([place]); here to follow-up. No  generalized seizure activity since 2011  Continue Keppra at current dose will refill Continue Gabapentin at current dose will refill F/U yearly  Dennie Bible, [facility], [facility], [facility] [address], [place], [place] [address] [phone]

## 2015-04-15 NOTE — Patient Instructions (Signed)
Continue Keppra at current dose will refill Continue Gabapentin at current dose will refill F/U yearly   

## 2015-04-19 ENCOUNTER — Ambulatory Visit: Payer: BC Managed Care – PPO | Admitting: Nurse Practitioner

## 2015-04-30 ENCOUNTER — Other Ambulatory Visit: Payer: BLUE CROSS/BLUE SHIELD

## 2015-04-30 DIAGNOSIS — E559 Vitamin D deficiency, unspecified: Secondary | ICD-10-CM

## 2015-05-01 LAB — VITAMIN D 25 HYDROXY (VIT D DEFICIENCY, FRACTURES): VIT D 25 HYDROXY: 29 ng/mL — AB (ref 30–100)

## 2015-05-05 ENCOUNTER — Other Ambulatory Visit: Payer: Self-pay | Admitting: Neurology

## 2015-05-13 ENCOUNTER — Other Ambulatory Visit: Payer: Self-pay | Admitting: Gynecology

## 2015-05-30 ENCOUNTER — Encounter: Payer: Self-pay | Admitting: Gynecology

## 2015-05-30 ENCOUNTER — Ambulatory Visit (INDEPENDENT_AMBULATORY_CARE_PROVIDER_SITE_OTHER): Payer: BLUE CROSS/BLUE SHIELD | Admitting: Gynecology

## 2015-05-30 VITALS — BP 128/86 | Ht 62.5 in | Wt 163.0 lb

## 2015-05-30 DIAGNOSIS — Z23 Encounter for immunization: Secondary | ICD-10-CM

## 2015-05-30 DIAGNOSIS — Z01419 Encounter for gynecological examination (general) (routine) without abnormal findings: Secondary | ICD-10-CM | POA: Diagnosis not present

## 2015-05-30 NOTE — Progress Notes (Signed)
Beth Blackwell [birth year] [id number]   History:    40 y.o.  for annual gyn exam with no complaints today. She is no longer taking the oxybutynin for her overactive bladder. She denies any nocturia or any urinary frequency. Her PCP Dr. Drue Blackwell has been treating her for hyperlipidemia and we'll be getting her blood work this year. Patient has had past history vitamin D deficiency currently taking vitamin D3 2000 units daily.Patient is also been [facility] for her seizure disorder which she sees every 6 months. She takes Neurontin 600 mg daily and is also on Keppra 500 mg daily as well. She takes Proventil inhaler when necessary for her asthma. Patient's last mammogram was in 2012 was normal only fatty tissue noted in the area of concern of the left breast as described above. She frequently does her self breast examination. Patient's vaccines are up-to-date. Patient with no prior history of abnormal Pap smears. Patient with history of abdominal hysterectomy in 2007. Patient's also has had history of laparoscopic lysis of pelvic adhesions.   Past medical history,surgical history, family history and social history were all reviewed and documented in the EPIC chart.  Gynecologic History No LMP recorded. Patient has had a hysterectomy. Contraception: status post hysterectomy Last Pap: 2012. Results were: normal Last mammogram: See above. Results were: See above  Obstetric History OB History  Gravida Para Term Preterm AB SAB TAB Ectopic Multiple Living  2 2            # Outcome Date GA Lbr Len/2nd Weight Sex Delivery Anes PTL Lv  2 Para      CS-LTranv     1 Para      CS-LTranv          ROS: A ROS was performed and pertinent positives and negatives are included in the history.  GENERAL: No fevers or chills. HEENT: No change in vision, no earache, sore throat or sinus congestion. NECK: No pain or stiffness. CARDIOVASCULAR: No chest pain or pressure. No palpitations.  PULMONARY: No shortness of breath, cough or wheeze. GASTROINTESTINAL: No abdominal pain, nausea, vomiting or diarrhea, melena or bright red blood per rectum. GENITOURINARY: No urinary frequency, urgency, hesitancy or dysuria. MUSCULOSKELETAL: No joint or muscle pain, no back pain, no recent trauma. DERMATOLOGIC: No rash, no itching, no lesions. ENDOCRINE: No polyuria, polydipsia, no heat or cold intolerance. No recent change in weight. HEMATOLOGICAL: No anemia or easy bruising or bleeding. NEUROLOGIC: No headache, seizures, numbness, tingling or weakness. PSYCHIATRIC: No depression, no loss of interest in normal activity or change in sleep pattern.     Exam: chaperone present  BP 128/86 mmHg  Ht 5' 2.5" (1.588 m)  Wt 163 lb (73.936 kg)  BMI 29.32 kg/m2  Body mass index is 29.32 kg/(m^2).  General appearance : Well developed well nourished female. No acute distress HEENT: Eyes: no retinal hemorrhage or exudates,  Neck supple, trachea midline, no carotid bruits, no thyroidmegaly Lungs: Clear to auscultation, no rhonchi or wheezes, or rib retractions  Heart: Regular rate and rhythm, no murmurs or gallops Breast:Examined in sitting and supine position were symmetrical in appearance, no palpable masses or tenderness,  no skin retraction, no nipple inversion, no nipple discharge, no skin discoloration, no axillary or supraclavicular lymphadenopathy Abdomen: no palpable masses or tenderness, no rebound or guarding Extremities: no edema or skin discoloration or tenderness  Pelvic:  Bartholin, Urethra, Skene Glands: Within normal limits  Vagina: No gross lesions or discharge  Cervix: Absent  Uterus  absent  Adnexa  Without masses or tenderness  Anus and perineum  normal   Rectovaginal  normal sphincter tone without palpated masses or tenderness             Hemoccult not indicated     Assessment/Plan:  39 y.o. female for annual exam doing well. Patient to receive the flu vaccine  today. Pap smear no longer indicated according to the new guidelines. Patient will need a mammogram next year. PCP doing her blood work. We discussed importance of calcium vitamin D and regular exercise for osteoporosis prevention.   Ok Edwards MD, 10:11 AM 05/30/2015

## 2015-06-17 ENCOUNTER — Encounter: Payer: Self-pay | Admitting: Internal Medicine

## 2015-06-17 ENCOUNTER — Telehealth: Payer: Self-pay | Admitting: Internal Medicine

## 2015-06-19 ENCOUNTER — Encounter: Payer: Self-pay | Admitting: Medical

## 2015-06-19 NOTE — Telephone Encounter (Signed)
Marked to charge and mailing letter °

## 2015-06-19 NOTE — Telephone Encounter (Signed)
Pt was no show 06/17/15 9:00am for cpe appt, pt called late afternoon same day and rescheduled for July 2017, charge or no charge?

## 2015-06-19 NOTE — Telephone Encounter (Signed)
Charge if called same day.

## 2015-06-28 NOTE — Telephone Encounter (Signed)
Ok to wave

## 2015-06-28 NOTE — Telephone Encounter (Signed)
I do not have authority to waive no charge or charge for no show fees. Forwarding to Dr. Drue Novel and [place].

## 2015-06-28 NOTE — Telephone Encounter (Signed)
[place] - please waive no show fee 06/17/15

## 2015-06-28 NOTE — Telephone Encounter (Signed)
Pt called in after receiving letter. She states she called day of appt stating that she was very sick and not able to come in for wellness exam. Pt asking if fee can be waived?

## 2015-08-08 ENCOUNTER — Other Ambulatory Visit: Payer: Self-pay | Admitting: Internal Medicine

## 2015-12-04 ENCOUNTER — Ambulatory Visit (INDEPENDENT_AMBULATORY_CARE_PROVIDER_SITE_OTHER): Payer: BLUE CROSS/BLUE SHIELD | Admitting: Internal Medicine

## 2015-12-04 ENCOUNTER — Encounter: Payer: Self-pay | Admitting: Internal Medicine

## 2015-12-04 VITALS — BP 120/76 | HR 68 | Temp 98.1°F | Ht 63.0 in | Wt 164.3 lb

## 2015-12-04 DIAGNOSIS — Z0001 Encounter for general adult medical examination with abnormal findings: Secondary | ICD-10-CM | POA: Diagnosis not present

## 2015-12-04 DIAGNOSIS — Z23 Encounter for immunization: Secondary | ICD-10-CM | POA: Diagnosis not present

## 2015-12-04 DIAGNOSIS — Z114 Encounter for screening for human immunodeficiency virus [HIV]: Secondary | ICD-10-CM | POA: Diagnosis not present

## 2015-12-04 DIAGNOSIS — Z0000 Encounter for general adult medical examination without abnormal findings: Secondary | ICD-10-CM

## 2015-12-04 DIAGNOSIS — L989 Disorder of the skin and subcutaneous tissue, unspecified: Secondary | ICD-10-CM

## 2015-12-04 DIAGNOSIS — Z09 Encounter for follow-up examination after completed treatment for conditions other than malignant neoplasm: Secondary | ICD-10-CM | POA: Insufficient documentation

## 2015-12-04 LAB — LIPID PANEL
CHOLESTEROL: 202 mg/dL — AB (ref 0–200)
HDL: 35.5 mg/dL — ABNORMAL LOW (ref >39.00)
NONHDL: 166.11
Total CHOL/HDL Ratio: 6
Triglycerides: 355 mg/dL — ABNORMAL HIGH (ref 0.0–149.0)
VLDL: 71 mg/dL — ABNORMAL HIGH (ref 0.0–40.0)

## 2015-12-04 LAB — BASIC METABOLIC PANEL
BUN: 11 mg/dL (ref 6–23)
CALCIUM: 9.5 mg/dL (ref 8.4–10.5)
CO2: 28 meq/L (ref 19–32)
Chloride: 110 mEq/L (ref 96–112)
Creatinine, Ser: 0.57 mg/dL (ref 0.40–1.20)
GFR: 124.73 mL/min (ref >60.00)
Glucose, Bld: 94 mg/dL (ref 70–99)
Potassium: 3.5 mEq/L (ref 3.5–5.1)
SODIUM: 135 meq/L (ref 135–145)

## 2015-12-04 LAB — AST: AST: 30 U/L (ref 0–37)

## 2015-12-04 LAB — ALT: ALT: 43 U/L — AB (ref 0–35)

## 2015-12-04 LAB — TSH: TSH: 2 u[IU]/mL (ref 0.35–4.50)

## 2015-12-04 LAB — HIV ANTIBODY (ROUTINE TESTING W REFLEX): HIV: NONREACTIVE

## 2015-12-04 LAB — LDL CHOLESTEROL, DIRECT: LDL DIRECT: 109 mg/dL

## 2015-12-04 MED ORDER — ALBUTEROL SULFATE HFA 108 (90 BASE) MCG/ACT IN AERS: 2.0000 | INHALATION_SPRAY | Freq: Four times a day (QID) | RESPIRATORY_TRACT | Status: DC | PRN

## 2015-12-04 NOTE — Progress Notes (Signed)
Subjective:    Patient ID: Beth Blackwell, female    DOB: 1975/06/26, 40 y.o.   MRN: [id number]  DOS:  12/04/2015 Type of visit - description : CPX Interval history: In general doing well. No major concerns    Review of Systems Constitutional: No fever. No chills. No unexplained wt changes. No unusual sweats  HEENT: No dental problems, no ear discharge, no facial swelling, no voice changes. No eye discharge, no eye  redness , no  intolerance to light   Respiratory: Once or twice a week at night has episodes of cough, chest tightness and some wheezing. Albuterol helps.  Cardiovascular: No CP, no leg swelling , no  Palpitations  GI: no nausea, no vomiting, no diarrhea , no  abdominal pain.  No blood in the stools. No dysphagia, no odynophagia    Endocrine: No polyphagia, no polyuria , no polydipsia  GU: No dysuria, gross hematuria, difficulty urinating. No urinary urgency, no frequency.  Musculoskeletal: No joint swellings or unusual aches or pains  Skin: No change in the color of the skin, palor , no  Rash. Has a skin lesion and the left leg that has increased in size a little lately  Allergic, immunologic: No environmental allergies , no  food allergies  Neurological: No dizziness no  syncope. No headaches. No diplopia, no slurred, no slurred speech, no motor deficits, no facial  Numbness  Hematological: No enlarged lymph nodes, no easy bruising , no unusual bleedings  Psychiatry: No suicidal ideas, no hallucinations, no beavior problems, no confusion.  No unusual/severe anxiety, no depression   Past Medical History  Diagnosis Date  . Depression   . Sickle cell trait (HCC)   . Seizures (HCC)     no seizures  since ~ 2012  . High cholesterol   . Retained orthopedic hardware 02/2014    right wrist/forearm  . Seasonal allergies   . Vitamin D deficiency     Past Surgical History  Procedure Laterality Date  . Appendectomy    . Cesarean section      x 2  . Wrist  arthroscopy with ulna shortening Right 06/25/2010    and TFCC debridement, shrinkage scapholunate ligament  . Tubal ligation  02/06/2000  . Abdominal hysterectomy  12/28/2005  . Ovarian cyst removal Right 09/22/2006  . Laparoscopic lysis of adhesions  09/22/2006    abdominopelvic  . Hardware removal Right 03/09/2014    Procedure: REMOVAL PLATE AND SCREW RIGHT ULNAR;  Surgeon: Cindee SaltGary Kuzma, MD;  Location: [facility];  Service: Orthopedics;  Laterality: Right;   Family History  Problem Relation Age of Onset  . Diabetes Mother   . Hypertension Mother   . Diabetes Father   . Hypertension Father   . Hypertension Sister   . Seizures Neg Hx   . Colon cancer Neg Hx   . Breast cancer Neg Hx     Social History   Social History  . Marital Status: Married    Spouse Name: N/A  . Number of Children: 2  . Years of Education: N/A   Occupational History  . farm work    Social History Main Topics  . Smoking status: Never Smoker   . Smokeless tobacco: Never Used  . Alcohol Use: No  . Drug Use: No  . Sexual Activity: Yes    Birth Control/ Protection: Surgical   Other Topics Concern  . Not on file   Social History Narrative   Original from [place]  Household-- pt, husband, 2 children (1996, 2001)        Medication List       This list is accurate as of: 12/04/15  6:00 PM.  Always use your most recent med list.               albuterol 108 (90 Base) MCG/ACT inhaler  Commonly known as:  VENTOLIN HFA  Inhale 2 puffs into the lungs every 6 (six) hours as needed for wheezing or shortness of breath.     CALTRATE 600+D 600-400 MG-UNIT tablet  Generic drug:  Calcium Carbonate-Vitamin D  Take 1 tablet by mouth daily.     cetirizine 10 MG tablet  Commonly known as:  ZYRTEC  Take 10 mg by mouth daily.     cholecalciferol 1000 units tablet  Commonly known as:  VITAMIN D  Take 2,000 Units by mouth daily.     fluticasone 50 MCG/ACT nasal spray  Commonly known  as:  FLONASE  Place 2 sprays into both nostrils daily.     gabapentin 600 MG tablet  Commonly known as:  NEURONTIN  Take 1 tablet (600 mg total) by mouth 3 (three) times daily.     levETIRAcetam 500 MG tablet  Commonly known as:  KEPPRA  Take 2 tablets (1,000 mg total) by mouth 2 (two) times daily.     Omega 3 1000 MG Caps  Take by mouth.     pravastatin 20 MG tablet  Commonly known as:  PRAVACHOL  Take 1 tablet (20 mg total) by mouth daily.           Objective:   Physical Exam  Skin:      BP 120/76 mmHg  Pulse 68  Temp(Src) 98.1 F (36.7 C) (Oral)  Ht  (1.6 m)  Wt 164 lb 5 oz (74.532 kg)  BMI 29.11 kg/m2  SpO2 96%  General:   Well developed, well nourished . NAD.  Neck: No  thyromegaly  HEENT:  Normocephalic . Face symmetric, atraumatic Lungs:  CTA B Normal respiratory effort, no intercostal retractions, no accessory muscle use. Heart: RRR,  no murmur.  No pretibial edema bilaterally  Abdomen:  Not distended, soft, non-tender. No rebound or rigidity.   Skin: Exposed areas without rash. Not pale. Not jaundice Neurologic:  alert & oriented X3.  Speech normal, gait appropriate for age and unassisted Strength symmetric and appropriate for age.  Psych: Cognition and judgment appear intact.  Cooperative with normal attention span and concentration.  Behavior appropriate. No anxious or depressed appearing.    Assessment & Plan:   Assessment Depression Hyperlipidemia Asthma Seasonal allergies Vitamin D deficiency Sickle cell trait History of seizures, last SZ ~ 2012, sees neurology  PLAN: Depression: Not an issue at this point Hyperlipidemia: On Pravachol, check labs Asthma: Pneumonia shot today, has sx one or twice a week, recommend albuterol as needed. Call if she has more frequent sx  Vit d  deficiency: S/P ergocalciferol, now on OTCs Skin lesion: Mole versus postinflammatory hyperpigmentation, refer to dermatology RTC one year

## 2015-12-04 NOTE — Assessment & Plan Note (Signed)
Depression: Not an issue at this point Hyperlipidemia: On Pravachol, check labs Asthma: Pneumonia shot today, has sx one or twice a week, recommend albuterol as needed. Call if she has more frequent sx  Vit d  deficiency: S/P ergocalciferol, now on OTCs Skin lesion: Mole versus postinflammatory hyperpigmentation, refer to dermatology RTC one year

## 2015-12-04 NOTE — Patient Instructions (Signed)
GO TO THE LAB : Get the blood work     GO TO THE FRONT DESK Schedule your next appointment for a  Physical exam in 1 year 

## 2015-12-04 NOTE — Progress Notes (Signed)
Pre visit review using our clinic review tool, if applicable. No additional management support is needed unless otherwise documented below in the visit note. 

## 2015-12-04 NOTE — Assessment & Plan Note (Addendum)
Td 2015; PNM 23 11-2015  Female care per gynecology Labs: BMP, AST, ALT, FLP, TSH, HIV.  Diet and exercise discussed

## 2015-12-05 ENCOUNTER — Other Ambulatory Visit: Payer: Self-pay | Admitting: Internal Medicine

## 2015-12-05 DIAGNOSIS — R945 Abnormal results of liver function studies: Principal | ICD-10-CM

## 2015-12-05 DIAGNOSIS — R7989 Other specified abnormal findings of blood chemistry: Secondary | ICD-10-CM

## 2015-12-20 ENCOUNTER — Other Ambulatory Visit: Payer: BLUE CROSS/BLUE SHIELD

## 2015-12-20 DIAGNOSIS — R7989 Other specified abnormal findings of blood chemistry: Secondary | ICD-10-CM

## 2015-12-20 DIAGNOSIS — R945 Abnormal results of liver function studies: Principal | ICD-10-CM

## 2015-12-21 LAB — HEPATITIS B SURFACE ANTIGEN: Hepatitis B Surface Ag: NEGATIVE

## 2015-12-21 LAB — HEPATITIS B CORE ANTIBODY, TOTAL: HEP B C TOTAL AB: NONREACTIVE

## 2015-12-21 LAB — HEPATITIS C ANTIBODY: HCV Ab: NEGATIVE

## 2016-03-13 ENCOUNTER — Other Ambulatory Visit: Payer: Self-pay | Admitting: Internal Medicine

## 2016-04-14 ENCOUNTER — Ambulatory Visit (INDEPENDENT_AMBULATORY_CARE_PROVIDER_SITE_OTHER): Payer: BLUE CROSS/BLUE SHIELD | Admitting: Nurse Practitioner

## 2016-04-14 ENCOUNTER — Encounter: Payer: Self-pay | Admitting: Nurse Practitioner

## 2016-04-14 VITALS — BP 120/71 | HR 68 | Resp 16 | Ht 63.0 in | Wt 165.0 lb

## 2016-04-14 DIAGNOSIS — G40909 Epilepsy, unspecified, not intractable, without status epilepticus: Secondary | ICD-10-CM | POA: Diagnosis not present

## 2016-04-14 DIAGNOSIS — G40209 Localization-related (focal) (partial) symptomatic epilepsy and epileptic syndromes with complex partial seizures, not intractable, without status epilepticus: Secondary | ICD-10-CM

## 2016-04-14 MED ORDER — LEVETIRACETAM 500 MG PO TABS: 1000.0000 mg | ORAL_TABLET | Freq: Two times a day (BID) | ORAL | 3 refills | Status: DC

## 2016-04-14 MED ORDER — GABAPENTIN 600 MG PO TABS: 600.0000 mg | ORAL_TABLET | Freq: Three times a day (TID) | ORAL | 3 refills | Status: DC

## 2016-04-14 NOTE — Progress Notes (Signed)
[facility]  PATIENT: Gigi Gin DOB: 01/04/1976   REASON FOR VISIT: Follow-up for seizure disorder HISTORY FROM: Patient and interpreter    HISTORY OF PRESENT ILLNESS:[name] is a 40 year old right-handed Hispanic female with a history of nocturnal seizure events. She has better  English at this visit and does not need  interpreter. The patient also has infrequent episodes of brief tremors affecting the right hand and arm. This has been going on for  years. The patient has had 2 generalized seizures at nighttime, but she has not had any events since 2011. She is being treated with Keppra and gabapentin, and she is tolerating the medications well. The patient does operate a motor vehicle without difficulty. She returns to this office for an evaluation. She reports no other new medical issues that have come up since last seen.   REVIEW OF SYSTEMS: Full 14 system review of systems performed and notable only for those listed, all others are neg:  Constitutional: Fatigue Cardiovascular: neg Ear/Nose/Throat: neg  Skin: neg Eyes: neg Respiratory: neg Gastroitestinal: neg  Hematology/Lymphatic: neg  Endocrine: neg Musculoskeletal:neg Allergy/Immunology: neg Neurological: Seizure disorder Psychiatric: neg Sleep : Insomnia ALLERGIES: Allergies  Allergen Reactions  . Acetaminophen Shortness Of Breath    HOME MEDICATIONS: Outpatient Medications Prior to Visit  Medication Sig Dispense Refill  . albuterol (VENTOLIN HFA) 108 (90 Base) MCG/ACT inhaler Inhale 2 puffs into the lungs every 6 (six) hours as needed for wheezing or shortness of breath. 18 g 6  . Calcium Carbonate-Vitamin D (CALTRATE 600+D) 600-400 MG-UNIT per tablet Take 1 tablet by mouth daily.      . cetirizine (ZYRTEC) 10 MG tablet Take 10 mg by mouth daily.    . cholecalciferol (VITAMIN D) 1000 units tablet Take 2,000 Units by mouth daily.    . fluticasone (FLONASE) 50 MCG/ACT nasal spray  Place 2 sprays into both nostrils daily. 16 g 6  . gabapentin (NEURONTIN) 600 MG tablet Take 1 tablet (600 mg total) by mouth 3 (three) times daily. 270 tablet 3  . levETIRAcetam (KEPPRA) 500 MG tablet Take 2 tablets (1,000 mg total) by mouth 2 (two) times daily. 360 tablet 3  . OMEGA 3 1000 MG CAPS Take by mouth.      . pravastatin (PRAVACHOL) 20 MG tablet Take 1 tablet (20 mg total) by mouth daily. 90 tablet 3   No facility-administered medications prior to visit.     PAST MEDICAL HISTORY: Past Medical History:  Diagnosis Date  . Depression   . High cholesterol   . Retained orthopedic hardware 02/2014   right wrist/forearm  . Seasonal allergies   . Seizures ([place])    no seizures  since ~ 2012  . Sickle cell trait ([place])   . Varicose vein of leg   . Vitamin D deficiency     PAST SURGICAL HISTORY: Past Surgical History:  Procedure Laterality Date  . ABDOMINAL HYSTERECTOMY  12/28/2005  . APPENDECTOMY    . CESAREAN SECTION     x 2  . HARDWARE REMOVAL Right 03/09/2014   Procedure: REMOVAL PLATE AND SCREW RIGHT ULNAR;  Surgeon: Daryll Brod, MD;  Location: [facility];  Service: Orthopedics;  Laterality: Right;  . LAPAROSCOPIC LYSIS OF ADHESIONS  09/22/2006   abdominopelvic  . OVARIAN CYST REMOVAL Right 09/22/2006  . TUBAL LIGATION  02/06/2000  . WRIST ARTHROSCOPY WITH ULNA SHORTENING Right 06/25/2010   and TFCC debridement, shrinkage scapholunate ligament    FAMILY HISTORY: Family History  Problem Relation Age of Onset  . Diabetes Mother   . Hypertension Mother   . Diabetes Father   . Hypertension Father   . Hypertension Sister   . Seizures Neg Hx   . Colon cancer Neg Hx   . Breast cancer Neg Hx     SOCIAL HISTORY: Social History   Social History  . Marital status: Married    Spouse name: N/A  . Number of children: 2  . Years of education: N/A   Occupational History  . farm work    Social History Main Topics  . Smoking status: Never Smoker  .  Smokeless tobacco: Never Used  . Alcohol use No  . Drug use: No  . Sexual activity: Yes    Birth control/ protection: Surgical   Other Topics Concern  . Not on file   Social History Narrative   Original from [place], [place]   Household-- pt, husband, 2 children (1996, 2001)     PHYSICAL EXAM  Vitals:   04/14/16 0749  BP: 120/71  Pulse: 68  Resp: 16  Weight: 165 lb (74.8 kg)  Height: '5\' 3"'$  (1.6 m)   Body mass index is 29.23 kg/m. General: The patient is alert and cooperative at the time of the examination. The patient is moderately obese. Skin: No significant peripheral edema is noted.  Neurologic Exam Mental status: The patient is oriented x 3. Cranial nerves: Facial symmetry is present. Speech is normal, no aphasia or dysarthria is noted. Extraocular movements are full. Visual fields are full. Motor: The patient has good strength in all 4 extremities. Sensory examination: Soft touch sensation on the face and arms are symmetric. Coordination: The patient has good finger-nose-finger and heel-to-shin bilaterally. Gait and station: The patient has a normal gait. Tandem gait is normal. Romberg is negative. No drift is seen. Reflexes: Deep tendon reflexes are symmetric upper and lower.     DIAGNOSTIC DATA (LABS, IMAGING, TESTING) - I reviewed patient records, labs, notes, testing and imaging myself where available.      Component Value Date/Time   NA 135 12/04/2015 1037   K 3.5 12/04/2015 1037   CL 110 12/04/2015 1037   CO2 28 12/04/2015 1037   GLUCOSE 94 12/04/2015 1037   BUN 11 12/04/2015 1037   CREATININE 0.57 12/04/2015 1037   CALCIUM 9.5 12/04/2015 1037   PROT 7.6 06/07/2014 0945   ALBUMIN 4.6 06/07/2014 0945   AST 30 12/04/2015 1037   ALT 43 (H) 12/04/2015 1037   ALKPHOS 61 06/07/2014 0945   BILITOT 0.4 06/07/2014 0945   GFRNONAA >60 09/21/2009 0755   GFRAA  09/21/2009 0755    >60        The eGFR has been calculated using the MDRD equation. This  calculation has not been validated in all clinical situations. eGFR's persistently <60 mL/min signify possible Chronic Kidney Disease.   Lab Results  Component Value Date   CHOL 202 (H) 12/04/2015   HDL 35.50 (L) 12/04/2015   LDLCALC 109 (H) 06/07/2014   LDLDIRECT 109.0 12/04/2015   TRIG 355.0 (H) 12/04/2015   CHOLHDL 6 12/04/2015       ASSESSMENT AND PLAN  40 y.o. year old female  has a past medical history of  Seizures ([place]); here to follow-up. No  generalized seizure activity since 2011  Continue Keppra at current dose will refill Continue Gabapentin at current dose will refill F/U yearly  Dennie Bible, [facility], [facility], APRN  [facility] [address], [address]  [place], [place] [address] [phone]

## 2016-04-14 NOTE — Progress Notes (Signed)
I have read the note, and I agree with the clinical assessment and plan.  Vernice Mannina KEITH   

## 2016-04-14 NOTE — Patient Instructions (Signed)
Continue Keppra at current dose will refill Continue Gabapentin at current dose will refill F/U yearly

## 2016-04-18 ENCOUNTER — Other Ambulatory Visit: Payer: Self-pay | Admitting: Nurse Practitioner

## 2016-06-25 ENCOUNTER — Ambulatory Visit (INDEPENDENT_AMBULATORY_CARE_PROVIDER_SITE_OTHER): Payer: BLUE CROSS/BLUE SHIELD | Admitting: Gynecology

## 2016-06-25 ENCOUNTER — Encounter: Payer: Self-pay | Admitting: Gynecology

## 2016-06-25 VITALS — BP 124/80 | Ht 63.0 in | Wt 165.0 lb

## 2016-06-25 DIAGNOSIS — Z23 Encounter for immunization: Secondary | ICD-10-CM | POA: Diagnosis not present

## 2016-06-25 DIAGNOSIS — E559 Vitamin D deficiency, unspecified: Secondary | ICD-10-CM

## 2016-06-25 DIAGNOSIS — Z01419 Encounter for gynecological examination (general) (routine) without abnormal findings: Secondary | ICD-10-CM

## 2016-06-25 NOTE — Patient Instructions (Signed)
Influenza Virus Vaccine (Flucelvax) Qu es este medicamento? La VACUNA ANTIGRIPAL ayuda a disminuir el riesgo de contraer la influenza, tambin conocida como la gripe. La vacuna solo ayuda a protegerle contra algunas cepas de influenza. MARCAS COMUNES: FLUCELVAX Qu le debo informar a mi profesional de la salud antes de tomar este medicamento? Necesita saber si usted presenta alguno de los siguientes problemas o situaciones: -trastorno de sangrado como hemofilia -fiebre o infeccin -sndrome de Guillain-Barre u otros problemas neurolgicos -problemas del sistema inmunolgico -infeccin por el virus de la inmunodeficiencia humana (VIH) o SIDA -niveles bajos de plaquetas en la sangre -esclerosis mltiple -una reaccin alrgica o inusual a las vacunas antigripales, a otros medicamentos, alimentos, colorantes o conservantes -si est embarazada o buscando quedar embarazada -si est amamantando a un beb Cmo debo utilizar este medicamento? Esta vacuna se administra mediante inyeccin por va intramuscular. Lo administra un profesional de la salud. Recibir una copia de informacin escrita sobre la vacuna antes de cada vacuna. Asegrese de leer este folleto cada vez cuidadosamente. Este folleto puede cambiar con frecuencia. Hable con su pediatra para informarse acerca del uso de este medicamento en nios. Puede requerir atencin especial. Qu sucede si me olvido de una dosis? No se aplica en este caso. Qu puede interactuar con este medicamento? -quimioterapia o radioterapia -medicamentos que suprimen el sistema inmunolgico, tales como etanercept, anakinra, infliximab y adalimumab -medicamentos que tratan o previenen cogulos sanguneos, como warfarina -fenitona -medicamentos esteroideos, como la prednisona o la cortisona -teofilina -vacunas A qu debo estar atento al usar este medicamento? Informe a su mdico o a su profesional de la salud sobre todos los efectos secundarios que  persistan despus de 3 das. Llame a su proveedor de atencin mdica si se presentan sntomas inusuales dentro de las 6 semanas de recibir esta vacuna. Es posible que todava pueda contraer la gripe, pero la enfermedad no ser tan fuerte como normalmente. No puede contraer la gripe de esta vacuna. La vacuna antigripal no le protege contra resfros u otras enfermedades que pueden causar fiebre. Debe vacunarse cada ao. Qu efectos secundarios puedo tener al utilizar este medicamento? Efectos secundarios que debe informar a su mdico o a su profesional de la salud tan pronto como sea posible: -reacciones alrgicas como erupcin cutnea, picazn o urticarias, hinchazn de la cara, labios o lengua Efectos secundarios que, por lo general, no requieren atencin mdica (debe informarlos a su mdico o a su profesional de la salud si persisten o si son molestos): -fiebre -dolor de cabeza -molestias y dolores musculares -dolor, sensibilidad, enrojecimiento o hinchazn en el lugar de la inyeccin -cansancio Dnde debo guardar mi medicina? Esta vacuna se administrar por un profesional de la salud en una clnica, farmacia, consultorio mdico u otro consultorio de un profesional de la salud. No se le suministrar esta vacuna para guardar en su domicilio.  2017 Elsevier/Gold Standard (2011-04-27 16:29:16)  

## 2016-06-25 NOTE — Progress Notes (Addendum)
Beth Blackwell Oct 09, 1975 [id number]   History:    41 y.o.  for annual gyn exam with no complaints today.Her PCP Dr. Drue Novel has been treating her for hyperlipidemia and we'll be getting her blood work this year. Patient has had past history vitamin D deficiency currently taking vitamin D3 2000 units daily.Patient is also been followed by [facility] neurology for her seizure disorder which she sees every 6 months. She takes Neurontin 600 mg daily and is also on Keppra 500 mg daily as well. She takes Proventil inhaler when necessary for her asthma. Patient's last mammogram was in 2012 was normal only fatty tissue noted in the area of concern of the left breast as described above. She frequently does her self breast examination. Patient requesting flu vaccine today. Patient with no past history of any abnormal Pap smears in the past.Patient with history of abdominal hysterectomy in 2007. Patient's also has had history of laparoscopic lysis of pelvic adhesions.   Past medical history,surgical history, family history and social history were all reviewed and documented in the EPIC chart.  Gynecologic History No LMP recorded. Patient has had a hysterectomy. Contraception: status post hysterectomy Last Pap: 2012. Results were: normal Last mammogram: 2012. Results were: See above  Obstetric History OB History  Gravida Para Term Preterm AB Living  2 2          SAB TAB Ectopic Multiple Live Births               # Outcome Date GA Lbr Len/2nd Weight Sex Delivery Anes PTL Lv  2 Para      CS-LTranv     1 Para      CS-LTranv          ROS: A ROS was performed and pertinent positives and negatives are included in the history.  GENERAL: No fevers or chills. HEENT: No change in vision, no earache, sore throat or sinus congestion. NECK: No pain or stiffness. CARDIOVASCULAR: No chest pain or pressure. No palpitations. PULMONARY: No shortness of breath, cough or wheeze. GASTROINTESTINAL: No abdominal pain,  nausea, vomiting or diarrhea, melena or bright red blood per rectum. GENITOURINARY: No urinary frequency, urgency, hesitancy or dysuria. MUSCULOSKELETAL: No joint or muscle pain, no back pain, no recent trauma. DERMATOLOGIC: No rash, no itching, no lesions. ENDOCRINE: No polyuria, polydipsia, no heat or cold intolerance. No recent change in weight. HEMATOLOGICAL: No anemia or easy bruising or bleeding. NEUROLOGIC: No headache, seizures, numbness, tingling or weakness. PSYCHIATRIC: No depression, no loss of interest in normal activity or change in sleep pattern.     Exam: chaperone present  BP 124/80   Ht 5\' 3"  (1.6 m)   Wt 165 lb (74.8 kg)   BMI 29.23 kg/m   Body mass index is 29.23 kg/m.  General appearance : Well developed well nourished female. No acute distress HEENT: Eyes: no retinal hemorrhage or exudates,  Neck supple, trachea midline, no carotid bruits, no thyroidmegaly Lungs: Clear to auscultation, no rhonchi or wheezes, or rib retractions  Heart: Regular rate and rhythm, no murmurs or gallops Breast:Examined in sitting and supine position were symmetrical in appearance, no palpable masses or tenderness,  no skin retraction, no nipple inversion, no nipple discharge, no skin discoloration, no axillary or supraclavicular lymphadenopathy Abdomen: no palpable masses or tenderness, no rebound or guarding Extremities: no edema or skin discoloration or tenderness  Pelvic:  Bartholin, Urethra, Skene Glands: Within normal limits  Vagina: No gross lesions or discharge  Cervix: Absent  Uterus  absent  Adnexa  Without masses or tenderness  Anus and perineum  normal   Rectovaginal  normal sphincter tone without palpated masses or tenderness             Hemoccult not indicated     Assessment/Plan:  41 y.o. female for annual exam received her flu vaccine today after she was counseled and literature information was provided in [place]. Patient no longer needs Pap smears  according to the new guidelines. A requisition for her to schedule her mammogram was provided. Discussed importance of monthly breast exams. Patient with past history vitamin D deficiency will check her vitamin D level today. The remainder of her blood work will be done by her PCP in the next few weeks in a fasting state.   Ok EdwardsFERNANDEZ,Jahiem Franzoni H MD, 11:20 AM 06/25/2016

## 2016-06-25 NOTE — Addendum Note (Signed)
Addended by: Kem ParkinsonBARNES, Deeksha Cotrell on: 06/25/2016 04:15 PM   Modules accepted: Orders

## 2016-06-26 ENCOUNTER — Other Ambulatory Visit: Payer: Self-pay | Admitting: Gynecology

## 2016-06-26 ENCOUNTER — Other Ambulatory Visit: Payer: Self-pay | Admitting: Anesthesiology

## 2016-06-26 DIAGNOSIS — Z1231 Encounter for screening mammogram for malignant neoplasm of breast: Secondary | ICD-10-CM

## 2016-06-26 DIAGNOSIS — E559 Vitamin D deficiency, unspecified: Secondary | ICD-10-CM

## 2016-06-26 LAB — VITAMIN D 25 HYDROXY (VIT D DEFICIENCY, FRACTURES): Vit D, 25-Hydroxy: 22 ng/mL — ABNORMAL LOW (ref 30–100)

## 2016-06-26 MED ORDER — VITAMIN D (ERGOCALCIFEROL) 1.25 MG (50000 UNIT) PO CAPS: 50000.0000 [IU] | ORAL_CAPSULE | ORAL | 0 refills | Status: DC

## 2016-07-07 ENCOUNTER — Ambulatory Visit
Admission: RE | Admit: 2016-07-07 | Discharge: 2016-07-07 | Disposition: A | Discharge status: Home / Self Care | Payer: BLUE CROSS/BLUE SHIELD | Source: Ambulatory Visit | Attending: Gynecology | Admitting: Gynecology

## 2016-07-07 DIAGNOSIS — Z1231 Encounter for screening mammogram for malignant neoplasm of breast: Secondary | ICD-10-CM

## 2016-09-16 ENCOUNTER — Other Ambulatory Visit: Payer: Self-pay | Admitting: Gynecology

## 2016-09-23 ENCOUNTER — Other Ambulatory Visit: Payer: BLUE CROSS/BLUE SHIELD

## 2016-10-07 ENCOUNTER — Encounter: Payer: Self-pay | Admitting: Gynecology

## 2016-10-12 ENCOUNTER — Telehealth: Payer: Self-pay | Admitting: *Deleted

## 2016-10-12 ENCOUNTER — Other Ambulatory Visit: Payer: BLUE CROSS/BLUE SHIELD

## 2016-10-12 DIAGNOSIS — E559 Vitamin D deficiency, unspecified: Secondary | ICD-10-CM

## 2016-10-12 NOTE — Telephone Encounter (Signed)
Patient no longer needs pap smears. She had a hysterectomy. Typo .

## 2016-10-12 NOTE — Telephone Encounter (Signed)
Dr.Fernandez claudia is going to read your response to patient, its a type in the first sentence "Patient had a complete gynecological examination and fiber this year". Fiber this year?

## 2016-10-12 NOTE — Telephone Encounter (Signed)
Patient had a complete gynecological examination and fiber this year. Patient also with past history of hysterectomy. According to the new guidelines she no longer needs Pap smears only complaint annual exam.

## 2016-10-12 NOTE — Telephone Encounter (Signed)
Patient informed. 

## 2016-10-12 NOTE — Telephone Encounter (Signed)
Pt called and spoke with Debarah CrapeClaudia concerned, she received a letter from her insurance company stating she is very overdue for pap smear. According to annual note on 06/25/16 her last pap was in 2012 and was normal. Pt would like to clarify, specifically why she doesn't  need a pap smear. Please advise

## 2016-10-13 ENCOUNTER — Other Ambulatory Visit: Payer: Self-pay | Admitting: Anesthesiology

## 2016-10-13 DIAGNOSIS — E559 Vitamin D deficiency, unspecified: Secondary | ICD-10-CM

## 2016-10-13 LAB — VITAMIN D 25 HYDROXY (VIT D DEFICIENCY, FRACTURES): Vit D, 25-Hydroxy: 21 ng/mL — ABNORMAL LOW (ref 30–100)

## 2016-10-13 MED ORDER — VITAMIN D (ERGOCALCIFEROL) 1.25 MG (50000 UNIT) PO CAPS: 50000.0000 [IU] | ORAL_CAPSULE | ORAL | 0 refills | Status: DC

## 2016-12-09 ENCOUNTER — Encounter (HOSPITAL_BASED_OUTPATIENT_CLINIC_OR_DEPARTMENT_OTHER): Payer: Self-pay

## 2016-12-09 ENCOUNTER — Ambulatory Visit (HOSPITAL_BASED_OUTPATIENT_CLINIC_OR_DEPARTMENT_OTHER): Admission: RE | Admit: 2016-12-09 | Payer: BLUE CROSS/BLUE SHIELD | Source: Ambulatory Visit

## 2016-12-09 ENCOUNTER — Ambulatory Visit (INDEPENDENT_AMBULATORY_CARE_PROVIDER_SITE_OTHER): Payer: BLUE CROSS/BLUE SHIELD | Admitting: Internal Medicine

## 2016-12-09 ENCOUNTER — Encounter: Payer: Self-pay | Admitting: Internal Medicine

## 2016-12-09 VITALS — BP 123/77 | HR 66 | Temp 98.2°F | Ht 63.0 in | Wt 169.0 lb

## 2016-12-09 DIAGNOSIS — W19XXXA Unspecified fall, initial encounter: Secondary | ICD-10-CM

## 2016-12-09 DIAGNOSIS — Z0000 Encounter for general adult medical examination without abnormal findings: Secondary | ICD-10-CM | POA: Diagnosis not present

## 2016-12-09 DIAGNOSIS — M533 Sacrococcygeal disorders, not elsewhere classified: Secondary | ICD-10-CM

## 2016-12-09 LAB — COMPREHENSIVE METABOLIC PANEL
ALBUMIN: 4.4 g/dL (ref 3.5–5.2)
ALK PHOS: 76 U/L (ref 39–117)
ALT: 100 U/L — ABNORMAL HIGH (ref 0–35)
AST: 63 U/L — ABNORMAL HIGH (ref 0–37)
BUN: 8 mg/dL (ref 6–23)
CALCIUM: 9.5 mg/dL (ref 8.4–10.5)
CHLORIDE: 104 meq/L (ref 96–112)
CO2: 28 mEq/L (ref 19–32)
Creatinine, Ser: 0.56 mg/dL (ref 0.40–1.20)
GFR: 126.66 mL/min (ref >60.00)
Glucose, Bld: 104 mg/dL — ABNORMAL HIGH (ref 70–99)
POTASSIUM: 3.7 meq/L (ref 3.5–5.1)
Sodium: 139 mEq/L (ref 135–145)
TOTAL PROTEIN: 6.9 g/dL (ref 6.0–8.3)
Total Bilirubin: 0.5 mg/dL (ref 0.2–1.2)

## 2016-12-09 LAB — CBC WITH DIFFERENTIAL/PLATELET
Basophils Absolute: 0 10*3/uL (ref 0.0–0.1)
Basophils Relative: 0.5 % (ref 0.0–3.0)
EOS ABS: 0.1 10*3/uL (ref 0.0–0.7)
Eosinophils Relative: 2.1 % (ref 0.0–5.0)
HCT: 38.1 % (ref 36.0–46.0)
HEMOGLOBIN: 12.8 g/dL (ref 12.0–15.0)
LYMPHS ABS: 1.7 10*3/uL (ref 0.7–4.0)
Lymphocytes Relative: 36.3 % (ref 12.0–46.0)
MCHC: 33.6 g/dL (ref 30.0–36.0)
MCV: 90.6 fl (ref 78.0–100.0)
MONO ABS: 0.3 10*3/uL (ref 0.1–1.0)
Monocytes Relative: 5.3 % (ref 3.0–12.0)
NEUTROS PCT: 55.8 % (ref 43.0–77.0)
Neutro Abs: 2.6 10*3/uL (ref 1.4–7.7)
Platelets: 239 10*3/uL (ref 150.0–400.0)
RBC: 4.2 Mil/uL (ref 3.87–5.11)
RDW: 12.7 % (ref 11.5–15.5)
WBC: 4.7 10*3/uL (ref 4.0–10.5)

## 2016-12-09 LAB — LIPID PANEL
CHOL/HDL RATIO: 6
Cholesterol: 183 mg/dL (ref 0–200)
HDL: 32.1 mg/dL — ABNORMAL LOW (ref >39.00)

## 2016-12-09 LAB — LDL CHOLESTEROL, DIRECT: LDL DIRECT: 78 mg/dL

## 2016-12-09 MED ORDER — MELOXICAM 7.5 MG PO TABS: 7.5000 mg | ORAL_TABLET | Freq: Every day | ORAL | 0 refills | Status: DC | PRN

## 2016-12-09 NOTE — Assessment & Plan Note (Signed)
-  Td 2015; PNM 23 11-2015 -Female care per gynecology; will see Dr Seymour BarsLavoie;  MMG 06-2016; no further PAPs per gyn -CCS: not indicated -Labs: FLP, CMP, CBC -Diet and exercise discussed

## 2016-12-09 NOTE — Patient Instructions (Addendum)
GO TO THE LAB : Get the blood work     GO TO THE FRONT DESK Schedule your next appointment for a  physical exam in one year  STOP BY THE FIRST FLOOR:  get the XR   Use doughnut pillow  Stop Advil, use meloxicam 7.5 mg one tablet daily. Take it with food, if it causes stomach problems a stop on the me know  For heartburn: Over-the-counter omeprazole 20 mg one tablet before breakfast for one month. If you are not improving the me know.

## 2016-12-09 NOTE — Progress Notes (Signed)
Pre visit review using our clinic review tool, if applicable. No additional management support is needed unless otherwise documented below in the visit note. 

## 2016-12-09 NOTE — Progress Notes (Signed)
Subjective:    Patient ID: Beth Blackwell, female    DOB: [birth year], 41 y.o.   MRN: [id number]  DOS:  12/09/2016 Type of visit - description : cpx Interval history: Since the last visit she is doing well except for a fall one month ago. It was a mechanical fall, no seizure or loss of consciousness. Landed on her buttocks, since then is having a tremendous amount of pain at the coccyx area and upper back. She is taking Advil with mixed results, pain is worse with movement, transferring to a chair or sitting.; overall pain is decreasing.    Review of Systems denies lower extremity paresthesias or numbness. Occasionally has epigastric pain and heartburn. Occasional nausea but no vomiting or change in the color of the stools. No weight loss    Other than above, a 14 point review of systems is negative    Past Medical History:  Diagnosis Date  . Depression   . High cholesterol   . Retained orthopedic hardware 02/2014   right wrist/forearm  . Seasonal allergies   . Seizures (HCC)    no seizures  since ~ 2012  . Sickle cell trait (HCC)   . Varicose vein of leg   . Vitamin D deficiency     Past Surgical History:  Procedure Laterality Date  . ABDOMINAL HYSTERECTOMY  12/28/2005   NO oophorectomy  . APPENDECTOMY    . CESAREAN SECTION     x 2  . HARDWARE REMOVAL Right 03/09/2014   Procedure: REMOVAL PLATE AND SCREW RIGHT ULNAR;  Surgeon: Cindee Salt, MD;  Location: [facility];  Service: Orthopedics;  Laterality: Right;  . LAPAROSCOPIC LYSIS OF ADHESIONS  09/22/2006   abdominopelvic  . OVARIAN CYST REMOVAL Right 09/22/2006  . TUBAL LIGATION  02/06/2000  . WRIST ARTHROSCOPY WITH ULNA SHORTENING Right 06/25/2010   and TFCC debridement, shrinkage scapholunate ligament    Social History   Social History  . Marital status: Married    Spouse name: N/A  . Number of children: 2  . Years of education: N/A   Occupational History  . farm work, Education officer, environmental     Social History  Main Topics  . Smoking status: Never Smoker  . Smokeless tobacco: Never Used  . Alcohol use No  . Drug use: No  . Sexual activity: Yes    Birth control/ protection: Surgical   Other Topics Concern  . Not on file   Social History Narrative   Original from [place], [place]   Household-- pt, husband, 2 children (1996, 2001)     Family History  Problem Relation Age of Onset  . Diabetes Mother   . Hypertension Mother   . Diabetes Father   . Hypertension Father   . Hypertension Sister   . Seizures Neg Hx   . Colon cancer Neg Hx   . Breast cancer Neg Hx      Allergies as of 12/09/2016      Reactions   Acetaminophen Shortness Of Breath      Medication List       Accurate as of 12/09/16 11:59 PM. Always use your most recent med list.          albuterol 108 (90 Base) MCG/ACT inhaler Commonly known as:  VENTOLIN HFA Inhale 2 puffs into the lungs every 6 (six) hours as needed for wheezing or shortness of breath.   CALTRATE 600+D 600-400 MG-UNIT tablet Generic drug:  Calcium Carbonate-Vitamin D Take 1 tablet by mouth daily.  cetirizine 10 MG tablet Commonly known as:  ZYRTEC Take 10 mg by mouth daily.   cholecalciferol 1000 units tablet Commonly known as:  VITAMIN D Take 2,000 Units by mouth daily.   fluticasone 50 MCG/ACT nasal spray Commonly known as:  FLONASE Place 2 sprays into both nostrils daily.   gabapentin 600 MG tablet Commonly known as:  NEURONTIN Take 1 tablet (600 mg total) by mouth 3 (three) times daily.   meloxicam 7.5 MG tablet Commonly known as:  MOBIC Take 1 tablet (7.5 mg total) by mouth daily as needed for pain.   Omega 3 1000 MG Caps Take by mouth.   pravastatin 20 MG tablet Commonly known as:  PRAVACHOL Take 1 tablet (20 mg total) by mouth daily.   Vitamin D (Ergocalciferol) 50000 units Caps capsule Commonly known as:  DRISDOL Take 1 capsule (50,000 Units total) by mouth every 7 (seven) days.          Objective:   Physical  Exam BP 123/77 (BP Location: Left Arm, Patient Position: Sitting, Cuff Size: Small)   Pulse 66   Temp 98.2 F (36.8 C) (Oral)   Ht 5\' 3"  (1.6 m)   Wt 169 lb (76.7 kg)   SpO2 98%   BMI 29.94 kg/m  General:   Well developed, well nourished . NAD.  HEENT:  Normocephalic . Face symmetric, atraumatic Lungs:  CTA B Normal respiratory effort, no intercostal retractions, no accessory muscle use. Heart: RRR,  no murmur.  no pretibial edema bilaterally  Abdomen:  Not distended, soft, non-tender. No rebound or rigidity.  Skin: Not pale. Not jaundice Neurologic:  alert & oriented X3.  Speech normal, gait and transferring antalgic due to sacral pain.  MSK: No TTP at the lumbar spine, slightly TTP at the sacral area. Hip rotation normal. Psych--  Cognition and judgment appear intact.  Cooperative with normal attention span and concentration.  Behavior appropriate. No anxious or depressed appearing.    Assessment & Plan:    Assessment Depression Hyperlipidemia Asthma Seasonal allergies Vitamin D deficiency Sickle cell trait History of seizures, last SZ ~ 2012, sees neurology LFTs elevated: Hep B and C:  (-) 2017  PLAN: Hyperlipidemia: Continue Pravachol, checking labs Asthma: Well controlled, hardly ever uses albuterol Vitamin D: Currently on ergocalciferol and OTCs. Fall, sacral contussion. taking Advil OTC, will try meloxicam 1 tablet daily, GI precautions discussed, recommend a doughnut pillow, call if improving does not continue. Check x-ray Occasional epigastric pain and heartburn: Recommend OTC PPIs. Skin lesion: Since last visit, saw dermatology. All instructions discussed in Spanish RTC one year. Sooner if needed

## 2016-12-10 ENCOUNTER — Telehealth: Payer: Self-pay | Admitting: Internal Medicine

## 2016-12-10 NOTE — Telephone Encounter (Signed)
-----   Message from Eugene Garnetarolyn S Chandler sent at 12/10/2016  9:18 AM EDT ----- Regarding: xrays Patient aware and will come in for xrays when she is ready.  Thanks, Eber Jonesarolyn

## 2016-12-10 NOTE — Assessment & Plan Note (Signed)
Hyperlipidemia: Continue Pravachol, checking labs Asthma: Well controlled, hardly ever uses albuterol Vitamin D: Currently on ergocalciferol and OTCs. Fall, sacral contussion. taking Advil OTC, will try meloxicam 1 tablet daily, GI precautions discussed, recommend a doughnut pillow, call if improving does not continue. Check x-ray Occasional epigastric pain and heartburn: Recommend OTC PPIs. Skin lesion: Since last visit, saw dermatology. All instructions discussed in Spanish RTC one year. Sooner if needed

## 2017-01-06 ENCOUNTER — Other Ambulatory Visit: Payer: Self-pay | Admitting: Internal Medicine

## 2017-01-06 NOTE — Telephone Encounter (Signed)
Having sacral pain, that may last for a while. Okay to refill #30 and one RF

## 2017-01-06 NOTE — Telephone Encounter (Signed)
Pt is requesting refill on meloxicam 7.5mg .   Last OV: 12/09/2016 Last Fill: 12/09/2016 #30 and 0RF  Okay to refill?

## 2017-01-06 NOTE — Telephone Encounter (Signed)
Rx sent 

## 2017-01-21 ENCOUNTER — Other Ambulatory Visit: Payer: BLUE CROSS/BLUE SHIELD

## 2017-01-21 DIAGNOSIS — E559 Vitamin D deficiency, unspecified: Secondary | ICD-10-CM

## 2017-01-22 ENCOUNTER — Other Ambulatory Visit: Payer: Self-pay | Admitting: Anesthesiology

## 2017-01-22 DIAGNOSIS — E559 Vitamin D deficiency, unspecified: Secondary | ICD-10-CM

## 2017-01-22 LAB — VITAMIN D 25 HYDROXY (VIT D DEFICIENCY, FRACTURES): VIT D 25 HYDROXY: 29 ng/mL — AB (ref 30–100)

## 2017-01-22 MED ORDER — VITAMIN D (ERGOCALCIFEROL) 1.25 MG (50000 UNIT) PO CAPS: 50000.0000 [IU] | ORAL_CAPSULE | ORAL | 0 refills | Status: DC

## 2017-02-15 ENCOUNTER — Encounter: Payer: Self-pay | Admitting: Internal Medicine

## 2017-02-15 ENCOUNTER — Ambulatory Visit (INDEPENDENT_AMBULATORY_CARE_PROVIDER_SITE_OTHER): Payer: BLUE CROSS/BLUE SHIELD | Admitting: Internal Medicine

## 2017-02-15 VITALS — BP 118/68 | HR 63 | Temp 97.8°F | Resp 14 | Ht 63.0 in | Wt 166.2 lb

## 2017-02-15 DIAGNOSIS — R7989 Other specified abnormal findings of blood chemistry: Secondary | ICD-10-CM

## 2017-02-15 DIAGNOSIS — R945 Abnormal results of liver function studies: Secondary | ICD-10-CM

## 2017-02-15 LAB — IRON: Iron: 121 ug/dL (ref 42–145)

## 2017-02-15 LAB — HEPATIC FUNCTION PANEL
ALBUMIN: 4.5 g/dL (ref 3.5–5.2)
ALK PHOS: 58 U/L (ref 39–117)
ALT: 38 U/L — ABNORMAL HIGH (ref 0–35)
AST: 18 U/L (ref 0–37)
Bilirubin, Direct: 0.1 mg/dL (ref 0.0–0.3)
TOTAL PROTEIN: 7.1 g/dL (ref 6.0–8.3)
Total Bilirubin: 0.6 mg/dL (ref 0.2–1.2)

## 2017-02-15 LAB — FERRITIN: Ferritin: 118.9 ng/mL (ref 10.0–291.0)

## 2017-02-15 NOTE — Progress Notes (Signed)
Subjective:    Patient ID: Beth Blackwell, female    DOB: March 13, 1976, 41 y.o.   MRN: [id number]  DOS:  02/15/2017 Type of visit - description : rov Interval history: Here for evaluation of increased LFTs. Chart reviewed, social history reviewed. The last time she was here, she also had sacral pain, but is much improved. Was DX with vitamin D deficiency at gynecology, on ergocalciferol.   Review of Systems Denies nausea, vomiting, diarrhea  Past Medical History:  Diagnosis Date  . Depression   . High cholesterol   . Retained orthopedic hardware 02/2014   right wrist/forearm  . Seasonal allergies   . Seizures (HCC)    no seizures  since ~ 2012  . Sickle cell trait (HCC)   . Varicose vein of leg   . Vitamin D deficiency     Past Surgical History:  Procedure Laterality Date  . ABDOMINAL HYSTERECTOMY  12/28/2005   NO oophorectomy  . APPENDECTOMY    . CESAREAN SECTION     x 2  . HARDWARE REMOVAL Right 03/09/2014   Procedure: REMOVAL PLATE AND SCREW RIGHT ULNAR;  Surgeon: Cindee Salt, MD;  Location: [facility];  Service: Orthopedics;  Laterality: Right;  . LAPAROSCOPIC LYSIS OF ADHESIONS  09/22/2006   abdominopelvic  . OVARIAN CYST REMOVAL Right 09/22/2006  . TUBAL LIGATION  02/06/2000  . WRIST ARTHROSCOPY WITH ULNA SHORTENING Right 06/25/2010   and TFCC debridement, shrinkage scapholunate ligament    Social History   Social History  . Marital status: Married    Spouse name: N/A  . Number of children: 2  . Years of education: N/A   Occupational History  . farm work, Education officer, environmental     Social History Main Topics  . Smoking status: Never Smoker  . Smokeless tobacco: Never Used  . Alcohol use No  . Drug use: No  . Sexual activity: Yes    Birth control/ protection: Surgical   Other Topics Concern  . Not on file   Social History Narrative   Original from [place], [place]   Household-- pt, husband, 2 children (1996, 2001)      Allergies as of 02/15/2017       Reactions   Acetaminophen Shortness Of Breath      Medication List       Accurate as of 02/15/17 11:59 PM. Always use your most recent med list.          albuterol 108 (90 Base) MCG/ACT inhaler Commonly known as:  VENTOLIN HFA Inhale 2 puffs into the lungs every 6 (six) hours as needed for wheezing or shortness of breath.   CALTRATE 600+D 600-400 MG-UNIT tablet Generic drug:  Calcium Carbonate-Vitamin D Take 1 tablet by mouth daily.   cetirizine 10 MG tablet Commonly known as:  ZYRTEC Take 10 mg by mouth daily.   cholecalciferol 1000 units tablet Commonly known as:  VITAMIN D Take 2,000 Units by mouth daily.   fluticasone 50 MCG/ACT nasal spray Commonly known as:  FLONASE Place 2 sprays into both nostrils daily.   gabapentin 600 MG tablet Commonly known as:  NEURONTIN Take 1 tablet (600 mg total) by mouth 3 (three) times daily.   meloxicam 7.5 MG tablet Commonly known as:  MOBIC Take 1 tablet (7.5 mg total) by mouth daily as needed for pain.   Omega 3 1000 MG Caps Take by mouth.   Vitamin D (Ergocalciferol) 50000 units Caps capsule Commonly known as:  DRISDOL Take 1 capsule (50,000  Units total) by mouth 2 (two) times a week. Take 1 capsule twice a week for 12 weeks.            Discharge Care Instructions        Start     Ordered   02/15/17 0000  Hepatic function panel     02/15/17 0851   02/15/17 0000  Antinuclear Antib (ANA)     02/15/17 0851   02/15/17 0000  Anti-smooth muscle antibody, IgG     02/15/17 0851   02/15/17 0000  Protein Electrophoresis, (serum)     02/15/17 0851   02/15/17 0000  Alpha-1-Antitrypsin     02/15/17 0851   02/15/17 0000  Iron     02/15/17 0851   02/15/17 0000  Ferritin     02/15/17 0851   02/15/17 0000  Transferrin Saturation     02/15/17 0851   02/15/17 0000  US Abdomen Limited RUQ    Question Answer Comment  Reason for Exam (SYMPTOM  OR DIAGNOSIS REQUIRED) elevated liver functions   Preferred imaging  location? [facility]      02/15/17 0854         Objective:   Physical Exam BP 118/68 (BP Location: Left Arm, Patient Position: Sitting, Cuff Size: Small)   Pulse 63   Temp 97.8 F (36.6 C) (Oral)   Resp 14   Ht  (1.6 m)   Wt 166 lb 4 oz (75.4 kg)   SpO2 97%   BMI 29.45 kg/m  General:   Well developed, well nourished . NAD.  HEENT:  Normocephalic . Face symmetric, atraumatic  Abdomen:  Not distended, soft, non-tender. No rebound or rigidity.   Skin: Not pale. Not jaundice Neurologic:  alert & oriented X3.  Speech normal, gait appropriate for age and unassisted Psych--  Cognition and judgment appear intact.  Cooperative with normal attention span and concentration.  Behavior appropriate. No anxious or depressed appearing.     Assessment & Plan:   Assessment Depression Hyperlipidemia Asthma Seasonal allergies Vitamin D deficiency Sickle cell trait History of seizures, last SZ ~ 2012, sees neurology LFTs elevated: Hep B and C:  (-) 2017  PLAN: Increase LFTs: Was first told about this problem few years ago, on chart review LFTs have been increased on and off. She  drinks alcohol  rarely, not taking Tylenol; on meloxicam prn (takes rarely). She has been on Pravachol for years. Hep B and C serology (-) 2017 Plan: DC Pravachol, abdominal ultrasound, LFTs, AMA, alpha 1 antitrypsin, SPEP, iron studies  RTC 4 months

## 2017-02-15 NOTE — Patient Instructions (Signed)
GO TO THE LAB : Get the blood work     GO TO THE FRONT DESK Schedule your next appointment for a  routine checkup in 4 months  Stop pravachol

## 2017-02-15 NOTE — Progress Notes (Signed)
Pre visit review using our clinic review tool, if applicable. No additional management support is needed unless otherwise documented below in the visit note. 

## 2017-02-16 DIAGNOSIS — R7989 Other specified abnormal findings of blood chemistry: Secondary | ICD-10-CM | POA: Insufficient documentation

## 2017-02-16 DIAGNOSIS — R945 Abnormal results of liver function studies: Secondary | ICD-10-CM | POA: Insufficient documentation

## 2017-02-16 NOTE — Assessment & Plan Note (Signed)
Increase LFTs: Was first told about this problem few years ago, on chart review LFTs have been increased on and off. She  drinks alcohol  rarely, not taking Tylenol; on meloxicam prn (takes rarely). She has been on Pravachol for years. Hep B and C serology (-) 2017 Plan: DC Pravachol, abdominal ultrasound, LFTs, AMA, alpha 1 antitrypsin, SPEP, iron studies  RTC 4 months

## 2017-02-17 ENCOUNTER — Other Ambulatory Visit: Payer: Self-pay

## 2017-02-17 LAB — TRANSFERRIN SATURATION
IRON SATN MFR SERPL: 34 % Saturation
IRON SERPL-MCNC: 117 ug/dL
TRANSFERRIN SERPL-MCNC: 246 mg/dL

## 2017-02-17 MED ORDER — MELOXICAM 7.5 MG PO TABS: 7.5000 mg | ORAL_TABLET | Freq: Every day | ORAL | 0 refills | Status: DC | PRN

## 2017-02-18 ENCOUNTER — Ambulatory Visit (HOSPITAL_BASED_OUTPATIENT_CLINIC_OR_DEPARTMENT_OTHER)
Admission: RE | Admit: 2017-02-18 | Discharge: 2017-02-18 | Disposition: A | Discharge status: Home / Self Care | Payer: BLUE CROSS/BLUE SHIELD | Source: Ambulatory Visit | Attending: Internal Medicine | Admitting: Internal Medicine

## 2017-02-18 ENCOUNTER — Telehealth: Payer: Self-pay | Admitting: *Deleted

## 2017-02-18 DIAGNOSIS — K760 Fatty (change of) liver, not elsewhere classified: Secondary | ICD-10-CM | POA: Diagnosis not present

## 2017-02-18 DIAGNOSIS — R7989 Other specified abnormal findings of blood chemistry: Secondary | ICD-10-CM | POA: Insufficient documentation

## 2017-02-18 DIAGNOSIS — K824 Cholesterolosis of gallbladder: Secondary | ICD-10-CM | POA: Insufficient documentation

## 2017-02-18 NOTE — Telephone Encounter (Signed)
Received results from [facility]; forwarded to provider/SLS 09/27

## 2017-02-19 LAB — PROTEIN ELECTROPHORESIS, SERUM
ALPHA 1: 0.2 g/dL (ref 0.2–0.3)
ALPHA 2: 0.7 g/dL (ref 0.5–0.9)
Albumin ELP: 4.4 g/dL (ref 3.8–4.8)
Beta 2: 0.3 g/dL (ref 0.2–0.5)
Beta Globulin: 0.4 g/dL (ref 0.4–0.6)
Gamma Globulin: 0.9 g/dL (ref 0.8–1.7)
TOTAL PROTEIN: 7 g/dL (ref 6.1–8.1)

## 2017-02-19 LAB — ANTI-SMOOTH MUSCLE ANTIBODY, IGG

## 2017-02-19 LAB — ANA: Anti Nuclear Antibody(ANA): NEGATIVE

## 2017-02-19 LAB — ALPHA-1-ANTITRYPSIN: A-1 Antitrypsin, Ser: 115 mg/dL (ref 83–199)

## 2017-03-13 ENCOUNTER — Other Ambulatory Visit: Payer: Self-pay | Admitting: Internal Medicine

## 2017-04-08 ENCOUNTER — Other Ambulatory Visit: Payer: Self-pay

## 2017-04-13 NOTE — Progress Notes (Signed)
[facility]  PATIENT: Beth Blackwell DOB: 23-Sep-1975   REASON FOR VISIT: Follow-up for seizure disorder HISTORY FROM: Patient     HISTORY OF PRESENT ILLNESS:Beth Blackwell is a 41 year old right-handed Hispanic female with a history of nocturnal seizure events. She has better  English at this visit and does not need  interpreter. The patient also has infrequent episodes of brief tremors affecting the right hand and arm. This has been going on for  years. The patient has had 2 generalized seizures at nighttime, but she has not had any events since 2011. She is being treated with Keppra and gabapentin, and she is tolerating the medications well. The patient does operate a motor vehicle without difficulty. She returns to this office for an evaluation. She reports no other new medical issues that have come up since last seen.  She also reports that for her follow-up visit next year she will not have insurance.  She was made aware of since her seizure disorder has been stable she may want to ask her primary care doctor because if he will manage this.  She has an appointment in January with him.  She returns for reevaluation and refills   REVIEW OF SYSTEMS: Full 14 system review of systems performed and notable only for those listed, all others are neg:  Constitutional: Fatigue Cardiovascular: neg Ear/Nose/Throat: neg  Skin: neg Eyes: neg Respiratory: neg Gastroitestinal: neg  Hematology/Lymphatic: neg  Endocrine: neg Musculoskeletal:neg Allergy/Immunology: neg Neurological: Seizure disorder Psychiatric: neg Sleep : Insomnia ALLERGIES: Allergies  Allergen Reactions  . Acetaminophen Shortness Of Breath    HOME MEDICATIONS: Outpatient Medications Prior to Visit  Medication Sig Dispense Refill  . albuterol (VENTOLIN HFA) 108 (90 Base) MCG/ACT inhaler Inhale 2 puffs into the lungs every 6 (six) hours as needed for wheezing or shortness of breath. 18 g 6  . Calcium  Carbonate-Vitamin D (CALTRATE 600+D) 600-400 MG-UNIT per tablet Take 1 tablet by mouth daily.      . cetirizine (ZYRTEC) 10 MG tablet Take 10 mg by mouth daily.    . cholecalciferol (VITAMIN D) 1000 units tablet Take 2,000 Units by mouth daily.    . fluticasone (FLONASE) 50 MCG/ACT nasal spray Place 2 sprays into both nostrils daily. 16 g 6  . gabapentin (NEURONTIN) 600 MG tablet Take 1 tablet (600 mg total) by mouth 3 (three) times daily. 270 tablet 3  . meloxicam (MOBIC) 7.5 MG tablet Take 1 tablet (7.5 mg total) by mouth daily as needed for pain. 90 tablet 0  . OMEGA 3 1000 MG CAPS Take by mouth.      . Vitamin D, Ergocalciferol, (DRISDOL) 50000 units CAPS capsule Take 1 capsule (50,000 Units total) by mouth 2 (two) times a week. Take 1 capsule twice a week for 12 weeks. 24 capsule 0   No facility-administered medications prior to visit.     PAST MEDICAL HISTORY: Past Medical History:  Diagnosis Date  . Depression   . High cholesterol   . Retained orthopedic hardware 02/2014   right wrist/forearm  . Seasonal allergies   . Seizures ([place])    no seizures  since ~ 2012  . Sickle cell trait ([place])   . Varicose vein of leg   . Vitamin D deficiency     PAST SURGICAL HISTORY: Past Surgical History:  Procedure Laterality Date  . ABDOMINAL HYSTERECTOMY  12/28/2005   NO oophorectomy  . APPENDECTOMY    . CESAREAN SECTION     x 2  .  HARDWARE REMOVAL Right 03/09/2014   Procedure: REMOVAL PLATE AND SCREW RIGHT ULNAR;  Surgeon: Daryll Brod, MD;  Location: [facility];  Service: Orthopedics;  Laterality: Right;  . LAPAROSCOPIC LYSIS OF ADHESIONS  09/22/2006   abdominopelvic  . OVARIAN CYST REMOVAL Right 09/22/2006  . TUBAL LIGATION  02/06/2000  . WRIST ARTHROSCOPY WITH ULNA SHORTENING Right 06/25/2010   and TFCC debridement, shrinkage scapholunate ligament    FAMILY HISTORY: Family History  Problem Relation Age of Onset  . Diabetes Mother   . Hypertension Mother   .  Diabetes Father   . Hypertension Father   . Hypertension Sister   . Seizures Neg Hx   . Colon cancer Neg Hx   . Breast cancer Neg Hx     SOCIAL HISTORY: Social History   Socioeconomic History  . Marital status: Married    Spouse name: Not on file  . Number of children: 2  . Years of education: Not on file  . Highest education level: Not on file  Social Needs  . Financial resource strain: Not on file  . Food insecurity - worry: Not on file  . Food insecurity - inability: Not on file  . Transportation needs - medical: Not on file  . Transportation needs - non-medical: Not on file  Occupational History  . Occupation: farm work, Education administrator   Tobacco Use  . Smoking status: Never Smoker  . Smokeless tobacco: Never Used  Substance and Sexual Activity  . Alcohol use: No    Alcohol/week: 0.0 oz  . Drug use: No  . Sexual activity: Yes    Birth control/protection: Surgical  Other Topics Concern  . Not on file  Social History Narrative   Original from [place], [place]   Household-- pt, husband, 2 children (1996, 2001)     PHYSICAL EXAM  Vitals:   04/14/17 0739  BP: 115/71  Pulse: 67  Weight: 166 lb (75.3 kg)  Height: '5\' 3"'  (1.6 m)   Body mass index is 29.41 kg/m. General: The patient is alert and cooperative at the time of the examination. The patient is moderately obese. Skin: No significant peripheral edema is noted.  Neurologic Exam Mental status: The patient is oriented x 3. Cranial nerves: Facial symmetry is present. Speech is normal, no aphasia or dysarthria is noted. Extraocular movements are full. Visual fields are full. Motor: The patient has good strength in all 4 extremities. Sensory examination: Soft touch sensation on the face and arms are symmetric. Coordination: The patient has good finger-nose-finger and heel-to-shin bilaterally. Gait and station: The patient has a normal gait. Tandem gait is normal. Romberg is negative. No drift is seen. Reflexes:  Deep tendon reflexes are symmetric upper and lower.     DIAGNOSTIC DATA (LABS, IMAGING, TESTING) - I reviewed patient records, labs, notes, testing and imaging myself where available.      Component Value Date/Time   NA 139 12/09/2016 0843   K 3.7 12/09/2016 0843   CL 104 12/09/2016 0843   CO2 28 12/09/2016 0843   GLUCOSE 104 (H) 12/09/2016 0843   BUN 8 12/09/2016 0843   CREATININE 0.56 12/09/2016 0843   CALCIUM 9.5 12/09/2016 0843   PROT 7.1 02/15/2017 0855   PROT 7.0 02/15/2017 0855   ALBUMIN 4.5 02/15/2017 0855   AST 18 02/15/2017 0855   ALT 38 (H) 02/15/2017 0855   ALKPHOS 58 02/15/2017 0855   BILITOT 0.6 02/15/2017 0855   GFRNONAA >60 09/21/2009 0755   GFRAA  09/21/2009 0755    >  60        The eGFR has been calculated using the MDRD equation. This calculation has not been validated in all clinical situations. eGFR's persistently <60 mL/min signify possible Chronic Kidney Disease.   Lab Results  Component Value Date   CHOL 183 12/09/2016   HDL 32.10 (L) 12/09/2016   LDLCALC 109 (H) 06/07/2014   LDLDIRECT 78.0 12/09/2016   TRIG (H) 12/09/2016    510.0 Triglyceride is over 400; calculations on Lipids are invalid.   CHOLHDL 6 12/09/2016       ASSESSMENT AND PLAN  41 y.o. year old female  has a past medical history of  Seizures ([place]); here to follow-up. No  generalized seizure activity since 2011.  She is currently maintained on Keppra and gabapentin.  Continue Keppra at current dose will refill Continue Gabapentin at current dose will refill F/U yearly and as needed as needed Beth Blackwell, [facility], [facility], APRN  [facility] [address] [place], [place] [address] [phone]

## 2017-04-14 ENCOUNTER — Encounter: Payer: Self-pay | Admitting: Nurse Practitioner

## 2017-04-14 ENCOUNTER — Ambulatory Visit: Payer: BLUE CROSS/BLUE SHIELD | Admitting: Nurse Practitioner

## 2017-04-14 ENCOUNTER — Encounter (INDEPENDENT_AMBULATORY_CARE_PROVIDER_SITE_OTHER): Payer: Self-pay

## 2017-04-14 VITALS — BP 115/71 | HR 67 | Ht 63.0 in | Wt 166.0 lb

## 2017-04-14 DIAGNOSIS — G40209 Localization-related (focal) (partial) symptomatic epilepsy and epileptic syndromes with complex partial seizures, not intractable, without status epilepticus: Secondary | ICD-10-CM

## 2017-04-14 MED ORDER — GABAPENTIN 600 MG PO TABS
600.0000 mg | ORAL_TABLET | Freq: Three times a day (TID) | ORAL | 3 refills | Status: DC
Start: 2017-04-14 — End: 2017-11-08

## 2017-04-14 MED ORDER — LEVETIRACETAM 500 MG PO TABS: 1000.0000 mg | ORAL_TABLET | Freq: Two times a day (BID) | ORAL | 11 refills | Status: DC

## 2017-04-14 NOTE — Patient Instructions (Signed)
Continue Keppra at current dose will refill Continue Gabapentin at current dose will refill F/U yearly   

## 2017-05-05 ENCOUNTER — Other Ambulatory Visit: Payer: Self-pay | Admitting: Gynecology

## 2017-05-05 ENCOUNTER — Other Ambulatory Visit: Payer: BLUE CROSS/BLUE SHIELD

## 2017-05-05 DIAGNOSIS — E559 Vitamin D deficiency, unspecified: Secondary | ICD-10-CM

## 2017-05-06 LAB — VITAMIN D 25 HYDROXY (VIT D DEFICIENCY, FRACTURES): Vit D, 25-Hydroxy: 41 ng/mL (ref 30–100)

## 2017-06-17 ENCOUNTER — Ambulatory Visit: Payer: BLUE CROSS/BLUE SHIELD | Admitting: Internal Medicine

## 2017-06-30 ENCOUNTER — Encounter: Payer: BLUE CROSS/BLUE SHIELD | Admitting: Obstetrics & Gynecology

## 2017-07-02 ENCOUNTER — Encounter: Payer: Self-pay | Admitting: Obstetrics & Gynecology

## 2017-07-02 ENCOUNTER — Ambulatory Visit (INDEPENDENT_AMBULATORY_CARE_PROVIDER_SITE_OTHER): Payer: Self-pay | Admitting: Obstetrics & Gynecology

## 2017-07-02 VITALS — BP 118/88 | Ht 63.0 in | Wt 165.6 lb

## 2017-07-02 DIAGNOSIS — Z01411 Encounter for gynecological examination (general) (routine) with abnormal findings: Secondary | ICD-10-CM

## 2017-07-02 DIAGNOSIS — E663 Overweight: Secondary | ICD-10-CM

## 2017-07-02 DIAGNOSIS — Z90710 Acquired absence of both cervix and uterus: Secondary | ICD-10-CM

## 2017-07-02 NOTE — Progress Notes (Signed)
Ulla GalloYuli I Bravo Apr 29, 1976 [id number]   History:    42 y.o. G2P2L2 Married.  Son is early 1720's, daughter is almost 42 yo.  RP:  Established patient presenting for annual gyn exam   HPI: S/P TAH 2007 for menorrhagia.  No pelvic pain.  No pain with IC.  Normal secretions.  No previous abnormal pap. Urine/BMs wnl.  Breasts wnl.  BMI 29.33.  Plans to start going to the gym.  Needs to improve her nutrition.  Health labs with Dr Drue NovelPaz.  Followed by Neurologist for Seizure disorder.  Past medical history,surgical history, family history and social history were all reviewed and documented in the EPIC chart.  Gynecologic History No LMP recorded. Patient has had a hysterectomy. Contraception: status post hysterectomy Last Pap: 2012. Results were: normal Last mammogram: 06/2016. Results were: Negative Bone Density: 12/2006 Colonoscopy: Never  Obstetric History OB History  Gravida Para Term Preterm AB Living  2 2       2   SAB TAB Ectopic Multiple Live Births               # Outcome Date GA Lbr Len/2nd Weight Sex Delivery Anes PTL Lv  2 Para      CS-LTranv     1 Para      CS-LTranv          ROS: A ROS was performed and pertinent positives and negatives are included in the history.  GENERAL: No fevers or chills. HEENT: No change in vision, no earache, sore throat or sinus congestion. NECK: No pain or stiffness. CARDIOVASCULAR: No chest pain or pressure. No palpitations. PULMONARY: No shortness of breath, cough or wheeze. GASTROINTESTINAL: No abdominal pain, nausea, vomiting or diarrhea, melena or bright red blood per rectum. GENITOURINARY: No urinary frequency, urgency, hesitancy or dysuria. MUSCULOSKELETAL: No joint or muscle pain, no back pain, no recent trauma. DERMATOLOGIC: No rash, no itching, no lesions. ENDOCRINE: No polyuria, polydipsia, no heat or cold intolerance. No recent change in weight. HEMATOLOGICAL: No anemia or easy bruising or bleeding. NEUROLOGIC: No headache, seizures, numbness,  tingling or weakness. PSYCHIATRIC: No depression, no loss of interest in normal activity or change in sleep pattern.     Exam:   BP 118/88 (BP Location: Right Arm, Patient Position: Sitting, Cuff Size: Normal)   Ht 5\' 3"  (1.6 m)   Wt 165 lb 9.6 oz (75.1 kg)   BMI 29.33 kg/m   Body mass index is 29.33 kg/m.  General appearance : Well developed well nourished female. No acute distress HEENT: Eyes: no retinal hemorrhage or exudates,  Neck supple, trachea midline, no carotid bruits, no thyroidmegaly Lungs: Clear to auscultation, no rhonchi or wheezes, or rib retractions  Heart: Regular rate and rhythm, no murmurs or gallops Breast:Examined in sitting and supine position were symmetrical in appearance, no palpable masses or tenderness,  no skin retraction, no nipple inversion, no nipple discharge, no skin discoloration, no axillary or supraclavicular lymphadenopathy Abdomen: no palpable masses or tenderness, no rebound or guarding Extremities: no edema or skin discoloration or tenderness  Pelvic: Vulva: Normal             Vagina: No gross lesions or discharge.  Declines Pap test, self-pay  Cervix/Uterus absent  Adnexa  Without masses or tenderness  Anus: Normal   Assessment/Plan:  42 y.o. female for annual exam   1. Encounter for gynecological examination with abnormal finding Gynecologic exam status post total abdominal hysterectomy.  Declines Pap test.  Breast exam normal.  Last mammogram February 2018 negative.  Will schedule screening mammograms every 1-2 years.  Health labs with Dr. Drue Novel.  Seizure disorder followed by neurologist.  2. S/P TAH (total abdominal hysterectomy)  3. Overweight (BMI 25.0-29.9) Will start on a low calorie/low carb nutrition plan.  [employer] recommended.  Recommend I aerobic activities 5 times a week and weightlifting every 2 days.  Genia Del MD, 3:18 PM 07/02/2017

## 2017-07-02 NOTE — Patient Instructions (Signed)
1. Encounter for gynecological examination with abnormal finding Gynecologic exam status post total abdominal hysterectomy.  Declines Pap test.  Breast exam normal.  Last mammogram February 2018 negative.  Will schedule screening mammograms every 1-2 years.  Health labs with Dr. Larose Kells.  Seizure disorder followed by neurologist.  2. S/P TAH (total abdominal hysterectomy)  3. Overweight (BMI 25.0-29.9) Will start on a low calorie/low carb nutrition plan.  [employer] recommended.  Recommend I aerobic activities 5 times a week and weightlifting every 2 days.  Romero Liner, fue un placer conocerle hoy!    Beth Blackwell (Health Maintenance, Female) Un estilo de vida saludable y los cuidados preventivos pueden favorecer considerablemente a la salud y Musician. Pregunte a su mdico cul es el cronograma de exmenes peridicos apropiado para usted. Esta es una buena oportunidad para consultarlo sobre cmo prevenir enfermedades y Bangs sano. Adems de los controles, hay muchas otras cosas que puede hacer usted mismo. Los expertos han realizado numerosas investigaciones [employer] cambios en el estilo de vida y las medidas de prevencin que, [place], lo ayudarn a mantenerse sano. Solicite a su mdico ms informacin. EL PESO Y LA DIETA Consuma una dieta saludable.  Asegrese de [employer] verduras, frutas, productos lcteos de bajo contenido de [place] y Advertising account planner.  No consuma muchos alimentos de alto contenido de grasas slidas, azcares agregados o sal.  Realice actividad fsica con regularidad. Esta es una de las prcticas ms importantes que puede hacer por su salud. ? La Delorise Shiner de los adultos deben hacer ejercicio durante al menos 127mnutos por semana. El ejercicio debe aumentar la frecuencia cardaca y pActorla transpiracin (ejercicio de iChetek. ? La mayora de los adultos tambin deben hacer ejercicios de elongacin al mToysRus veces a la semana. Agregue esto al su plan de ejercicio de intensidad moderada. Mantenga un peso saludable.  El ndice de masa corporal ([facility] es una medida que puede utilizarse para identificar posibles problemas de pAshippun Proporciona una estimacin de la grasa corporal basndose en el peso y la altura. Su mdico puede ayudarle a dRadiation protection practitionerIHoly Crossy a lScientist, forensico mTheatre managerun peso saludable.  Para las mujeres de 20aos o ms: ? Un IPike County Memorial Hospitalmenor de 18,5 se considera bajo peso. ? Un IBdpec Asc Show Lowentre 18,5 y 24,9 es normal. ? Un ISalt Lake Behavioral Healthentre 25 y 29,9 se considera sobrepeso. ? Un IMC de 30 o ms se considera obesidad. Observe los niveles de colesterol y lpidos en la sangre.  Debe comenzar a rEnglish as a second language teacherde lpidos y cResearch officer, trade unionen la sangre a los 20aos y luego repetirlos cada 536aos  Es posible que nAutomotive engineerlos niveles de colesterol con mayor frecuencia si: ? Sus niveles de lpidos y colesterol son altos. ? Es mayor de 544WNU ? Presenta un alto riesgo de padecer enfermedades cardacas. DETECCIN DE CNCER Cncer de pulmn  Se recomienda realizar exmenes de deteccin de cncer de pulmn a personas adultas entre 578y 880aos que estn en riesgo de dHorticulturist, commercialde pulmn por sus antecedentes de consumo de tabaco.  Se recomienda una tomografa computarizada de baja dosis de los pulmones todos los aos a las personas que: ? Fuman actualmente. ? Hayan dejado el hbito en algn momento en los ltimos 15aos. ? Hayan fumado durante 30aos un paquete diario. Un paquete-ao equivale a fumar un promedio de un paquete de cigarrillos diario durante un ao.  Los exmenes de deteccin anuales deben continuar hasta que hayan pasado  15aos desde que dej de fumar.  Ya no debern realizarse si tiene un problema de salud que le impida recibir tratamiento para Science writer de pulmn. Cncer de mama  Practique la autoconciencia de la mama. Esto significa reconocer la apariencia normal de sus mamas  y cmo las siente.  Tambin significa realizar autoexmenes regulares de [employer]. Informe a su mdico sobre cualquier cambio, sin importar cun pequeo sea.  Si tiene entre 20 y 92 aos, un mdico debe realizarle un examen clnico de las mamas como parte del examen regular de [place], cada 1 a 3aos.  Si tiene 40aos o ms, debe Information systems manager clnico de las Microsoft. Tambin considere realizarse una [place] todos los Shell.  Si tiene antecedentes familiares de cncer de mama, hable con su mdico para someterse a un estudio gentico.  Si tiene alto riesgo de Chief Financial Officer de mama, hable con su mdico para someterse a Public house manager y [employer].  La evaluacin del gen del cncer de mama (BRCA) se recomienda a mujeres que tengan familiares con cnceres relacionados con el BRCA. Los cnceres relacionados con el BRCA incluyen los siguientes: ? [place]. ? Ovario. ? Trompas. ? Cnceres de peritoneo.  Los resultados de la evaluacin determinarn la necesidad de asesoramiento gentico y de [place] de BRCA1 y BRCA2. Cncer de cuello del tero El mdico puede recomendarle que se haga pruebas peridicas de deteccin de cncer de los rganos de la pelvis (ovarios, tero y vagina). Estas pruebas incluyen un examen plvico, que abarca controlar si se produjeron cambios microscpicos en la superficie del cuello del tero (prueba de Papanicolaou). Pueden recomendarle que se haga estas pruebas cada 3aos, a partir de los 21aos.  A las mujeres que tienen entre 30 y 74aos, los mdicos pueden recomendarles que se sometan a exmenes plvicos y pruebas de Papanicolaou cada 28aos, o a la prueba de Papanicolaou y el examen plvico en combinacin con estudios de deteccin del virus del papiloma humano (VPH) cada 5aos. Algunos tipos de VPH aumentan el riesgo de Chief Financial Officer de cuello del tero. La prueba para la deteccin del VPH  tambin puede realizarse a mujeres de cualquier edad cuyos resultados de la prueba de Papanicolaou no sean claros.  Es posible que otros mdicos no recomienden exmenes de deteccin a mujeres no embarazadas que se consideran sujetos de bajo riesgo de Chief Financial Officer de pelvis y que no tienen sntomas. Pregntele al mdico si un examen plvico de deteccin es adecuado para usted.  Si ha recibido un tratamiento para Science writer cervical o una enfermedad que podra causar cncer, necesitar realizarse una prueba de Papanicolaou y controles durante al menos 5 aos de concluido el [place]. Si no se ha hecho el Papanicolaou con regularidad, debern volver a evaluarse los factores de riesgo (como tener un nuevo compaero sexual), para Teacher, adult education si debe realizarse los estudios nuevamente. Algunas mujeres sufren problemas mdicos que aumentan la probabilidad de Museum/gallery curator cncer de cuello del tero. En estos casos, el mdico podr [employer]ALCOMM se realicen controles y pruebas de Papanicolaou con ms frecuencia. Cncer colorrectal  Este tipo de cncer puede detectarse y a menudo prevenirse.  Por lo general, los estudios de rutina se deben Medical laboratory scientific officer a Field seismologist a Proofreader de los 26 aos y [place] 57 aos.  Sin embargo, el mdico podr aconsejarle que lo haga antes, si tiene factores de riesgo para el cncer de colon.  Tambin puede recomendarle que use  un kit de prueba para Hydrologist en la materia fecal.  Es posible que se use una pequea cmara en el extremo de un tubo para examinar directamente el colon (sigmoidoscopia o colonoscopia) a fin de Hydrographic surveyor formas tempranas de cncer colorrectal.  Los exmenes de rutina generalmente comienzan a los 28aos.  El examen directo del colon se debe repetir cada 5 a 10aos hasta los 75aos. Sin embargo, es posible que se realicen exmenes con mayor frecuencia, si se detectan formas tempranas de plipos precancerosos o pequeos bultos. Cncer de piel  Revise  la piel de la cabeza a los pies con regularidad.  Informe a su mdico si aparecen nuevos lunares o los que tiene se modifican, especialmente en su forma y color.  Tambin notifique al mdico si tiene un lunar que es ms grande que el tamao de una goma de lpiz.  Siempre use pantalla solar. Aplique pantalla solar de Kerry Dory y repetida a lo largo del Training and development officer.  Protjase usando mangas y [employer], un sombrero de ala ancha y gafas para el sol, siempre que se encuentre en el exterior. ENFERMEDADES CARDACAS, DIABETES E HIPERTENSIN ARTERIAL  La hipertensin arterial causa enfermedades cardacas y [place] el riesgo de ictus. La hipertensin arterial es ms probable en los siguientes casos: ? Las personas que tienen la presin arterial en el extremo del rango normal (100-139/85-89 mm Hg). ? [employer] con sobrepeso u obesidad. ? Scientist, water quality.  Si usted tiene entre 18 y 39 aos, debe medirse la presin arterial cada 3 a 5 aos. Si usted tiene 40 aos o ms, debe medirse la presin arterial [employer]lett[employer]ard. Debe medirse la presin arterial dos veces: una vez cuando est en un hospital o una clnica y la otra vez cuando est en otro sitio. Registre el promedio de [employer]. Para controlar su presin arterial cuando no est en un hospital o [place], puede usar lo siguiente: ? Jorje Guild automtica para medir la presin arterial en una farmacia. ? Un monitor para medir la presin arterial en el hogar.  Si tiene entre 34 y 2 aos, consulte a su mdico si debe tomar aspirina para prevenir el ictus.  Realcese exmenes de deteccin de la diabetes con regularidad. Esto incluye la toma de [place] de sangre para controlar el nivel de azcar en la sangre durante el [place]. ? Si tiene un peso normal y un bajo riesgo de padecer diabetes, realcese este anlisis cada tres aos despus de los 45aos. ? Si tiene sobrepeso y un alto riesgo de padecer diabetes, considere  someterse a este anlisis antes o con mayor frecuencia. PREVENCIN DE INFECCIONES HepatitisB  Si tiene un riesgo ms alto de Museum/gallery curator hepatitis B, debe someterse a un examen de deteccin de este virus. Se considera que tiene un alto riesgo de contraer hepatitis B si: ? Naci en un pas donde la hepatitis B es frecuente. Pregntele a su mdico qu pases son considerados de Public affairs consultant. ? Sus padres nacieron en un pas de alto riesgo y usted no recibi una vacuna que lo proteja contra la hepatitis B (vacuna contra la hepatitis B). ? [place]. ? [place] agujas para inyectarse drogas. ? Vive con alguien que tiene hepatitis B. ? Ha tenido sexo con alguien que tiene hepatitis B. ? Recibe tratamiento de hemodilisis. ? Toma ciertos medicamentos para el cncer, trasplante de rganos y afecciones autoinmunitarias. Hepatitis C  Se recomienda un anlisis de [place] para: ? [employer] Omaha  que nacieron entre 1945 y 59. ? Todas las personas que tengan un riesgo de haber contrado hepatitis C. Enfermedades de transmisin sexual (ETS).  Debe realizarse pruebas de deteccin de enfermedades de transmisin sexual (ETS), incluidas gonorrea y clamidia si: ? Es sexualmente activo y es menor de 91BTY. ? Es mayor de 24aos, y Investment banker, operational informa que corre riesgo de tener este tipo de infecciones. ? La actividad sexual ha cambiado desde que le hicieron la ltima prueba de deteccin y tiene un riesgo mayor de Best boy clamidia o Radio broadcast assistant. Pregntele al mdico si usted tiene riesgo.  Si no tiene el VIH, pero corre riesgo de infectarse por el virus, se recomienda tomar diariamente un medicamento recetado para evitar la infeccin. Esto se conoce como profilaxis previa a la exposicin. Se considera que est en riesgo si: ? Es [place] sexualmente y no [place] preservativos habitualmente o no conoce el estado del VIH de sus Advertising copywriter. ? Se inyecta drogas. ? Es [place] sexualmente con Ardelia Mems pareja que tiene  VIH. Consulte a su mdico para saber si tiene un alto riesgo de infectarse por el VIH. Si opta por comenzar la profilaxis previa a la exposicin, primero debe realizarse anlisis de deteccin del VIH. Luego, le harn anlisis cada 76mses mientras est tomando los medicamentos para la profilaxis previa a la exposicin. [facility] Si es premenopusica y puede quedar eLeoma solicite a su mdico asesoramiento previo a la concepcin.  Si puede quedar embarazada, tome 400 a [id number](mcg) de cido fAnheuser-Busch  Si desea evitar el embarazo, hable con su mdico sobre el control de la natalidad (anticoncepcin). OSTEOPOROSIS Y MENOPAUSIA  La osteoporosis es una enfermedad en la que los huesos pierden los minerales y la fuerza por el avance de la edad. El resultado pueden ser fracturas graves en los h[place]illsboro [place] El riesgo de osteoporosis puede identificarse con uArdelia Memsprueba de densidad sea.  Si tiene 65aos o ms, o si est en riesgo de sufrir osteoporosis y fracturas, pregunte a su mdico si debe someterse a exmenes.  Consulte a su mdico si debe tomar un suplemento de calcio o de vitamina D para reducir el riesgo de osteoporosis.  La menopausia puede presentar ciertos sntomas fsicos y rGaffer  La terapia de reemplazo hormonal puede reducir algunos de estos sntomas y rGaffer Consulte a su mdico para saber si la terapia de reemplazo hormonal es conveniente para usted. INSTRUCCIONES PARA EL CUIDADO EN EL HOGAR  Realcese los estudios de rutina de la salud, dentales y de lPublic librarian  [place]  No consuma ningn producto que contenga tabaco, lo que incluye cigarrillos, tabaco de mHigher education careers advisero cPsychologist, sport and exercise  Si est embarazada, no beba alcohol.  Si est amamantando, reduzca el consumo de alcohol y la frecuencia con la que consume.  Si es mujer y no est embarazada limite el consumo de alcohol a no ms de 1 medida por da. Una medida equivale a  12onzas de cerveza, 5onzas de vino o 1onzas de bebidas alcohlicas de alta graduacin.  No consuma drogas.  No comparta agujas.  Solicite ayuda a su mdico si necesita apoyo o informacin para abandonar las drogas.  Informe a su mdico si a menudo se siente deprimido.  Notifique a su mdico si alguna vez ha sido vctima de abuso o si no se siente seguro en su hogar. Esta informacin no tiene cMarine scientistel consejo del mdico. Asegrese de hacerle al mdico cualquier pregunta que tenga. Document  Released: 04/30/2011 Document Revised: 06/01/2014 Document Reviewed: 02/12/2015 Elsevier Interactive Patient Education  [employer].

## 2017-11-08 ENCOUNTER — Encounter: Payer: Self-pay | Admitting: Internal Medicine

## 2017-11-08 ENCOUNTER — Ambulatory Visit (INDEPENDENT_AMBULATORY_CARE_PROVIDER_SITE_OTHER): Payer: Self-pay | Admitting: Internal Medicine

## 2017-11-08 VITALS — BP 124/68 | HR 56 | Temp 98.3°F | Resp 14 | Ht 63.0 in | Wt 160.1 lb

## 2017-11-08 DIAGNOSIS — R7989 Other specified abnormal findings of blood chemistry: Secondary | ICD-10-CM

## 2017-11-08 DIAGNOSIS — R945 Abnormal results of liver function studies: Secondary | ICD-10-CM

## 2017-11-08 DIAGNOSIS — G40909 Epilepsy, unspecified, not intractable, without status epilepticus: Secondary | ICD-10-CM

## 2017-11-08 DIAGNOSIS — E785 Hyperlipidemia, unspecified: Secondary | ICD-10-CM

## 2017-11-08 LAB — COMPREHENSIVE METABOLIC PANEL
ALBUMIN: 4.4 g/dL (ref 3.5–5.2)
ALT: 38 U/L — ABNORMAL HIGH (ref 0–35)
AST: 18 U/L (ref 0–37)
Alkaline Phosphatase: 65 U/L (ref 39–117)
BUN: 12 mg/dL (ref 6–23)
CALCIUM: 9.4 mg/dL (ref 8.4–10.5)
CHLORIDE: 105 meq/L (ref 96–112)
CO2: 28 meq/L (ref 19–32)
CREATININE: 0.53 mg/dL (ref 0.40–1.20)
GFR: 134.37 mL/min (ref >60.00)
Glucose, Bld: 97 mg/dL (ref 70–99)
POTASSIUM: 3.8 meq/L (ref 3.5–5.1)
Sodium: 140 mEq/L (ref 135–145)
Total Bilirubin: 0.4 mg/dL (ref 0.2–1.2)
Total Protein: 6.7 g/dL (ref 6.0–8.3)

## 2017-11-08 LAB — LIPID PANEL
CHOL/HDL RATIO: 5
CHOLESTEROL: 205 mg/dL — AB (ref 0–200)
HDL: 37.3 mg/dL — AB (ref >39.00)
NONHDL: 167.4
TRIGLYCERIDES: 318 mg/dL — AB (ref 0.0–149.0)
VLDL: 63.6 mg/dL — AB (ref 0.0–40.0)

## 2017-11-08 LAB — LDL CHOLESTEROL, DIRECT: Direct LDL: 107 mg/dL

## 2017-11-08 MED ORDER — GABAPENTIN 600 MG PO TABS: 600.0000 mg | ORAL_TABLET | Freq: Three times a day (TID) | ORAL | 1 refills | Status: DC

## 2017-11-08 MED ORDER — LEVETIRACETAM 500 MG PO TABS: 1000.0000 mg | ORAL_TABLET | Freq: Two times a day (BID) | ORAL | 5 refills | Status: DC

## 2017-11-08 NOTE — Progress Notes (Signed)
Subjective:    Patient ID: Beth Blackwell, female    DOB: February 08, 1976, 42 y.o.   MRN: [id number]  DOS:  11/08/2017 Type of visit - description : f/u Interval history: Increased LFTs: Work-up reviewed with the patient Asthma: Essentially no symptoms. Seizure disorder: Neurology note reviewed, patient is self decreasing medications mostly due to cost, states that having no problems and wonders if that is okay.   Review of Systems Improving her diet and exercise.  Going to the gym.  Past Medical History:  Diagnosis Date  . Depression   . High cholesterol   . Retained orthopedic hardware 02/2014   right wrist/forearm  . Seasonal allergies   . Seizures (HCC)    no seizures  since ~ 2012  . Sickle cell trait (HCC)   . Varicose vein of leg   . Vitamin D deficiency     Past Surgical History:  Procedure Laterality Date  . ABDOMINAL HYSTERECTOMY  12/28/2005   NO oophorectomy  . APPENDECTOMY    . CESAREAN SECTION     x 2  . HARDWARE REMOVAL Right 03/09/2014   Procedure: REMOVAL PLATE AND SCREW RIGHT ULNAR;  Surgeon: Cindee SaltGary Kuzma, MD;  Location: [facility];  Service: Orthopedics;  Laterality: Right;  . LAPAROSCOPIC LYSIS OF ADHESIONS  09/22/2006   abdominopelvic  . OVARIAN CYST REMOVAL Right 09/22/2006  . TUBAL LIGATION  02/06/2000  . WRIST ARTHROSCOPY WITH ULNA SHORTENING Right 06/25/2010   and TFCC debridement, shrinkage scapholunate ligament    Social History   Socioeconomic History  . Marital status: Married    Spouse name: Not on file  . Number of children: 2  . Years of education: Not on file  . Highest education level: Not on file  Occupational History  . Occupation: farm work, Biomedical scientistcleaning   Social Needs  . Financial resource strain: Not on file  . Food insecurity:    Worry: Not on file    Inability: Not on file  . Transportation needs:    Medical: Not on file    Non-medical: Not on file  Tobacco Use  . Smoking status: Never Smoker  . Smokeless tobacco:  Never Used  Substance and Sexual Activity  . Alcohol use: No    Alcohol/week: 0.0 oz  . Drug use: No  . Sexual activity: Yes    Partners: Male    Birth control/protection: Surgical    Comment: first intercourse at [age], married 25.  Lifestyle  . Physical activity:    Days per week: Not on file    Minutes per session: Not on file  . Stress: Not on file  Relationships  . Social connections:    Talks on phone: Not on file    Gets together: Not on file    Attends religious service: Not on file    Active member of club or organization: Not on file    Attends meetings of clubs or organizations: Not on file    Relationship status: Not on file  . Intimate partner violence:    Fear of current or ex partner: Not on file    Emotionally abused: Not on file    Physically abused: Not on file    Forced sexual activity: Not on file  Other Topics Concern  . Not on file  Social History Narrative   [place]   Household-- pt, husband, 2 children (1996, 2001)      Allergies as of 11/08/2017  Reactions   Acetaminophen Shortness Of Breath      Medication List        Accurate as of 11/08/17 11:59 PM. Always use your most recent med list.          albuterol 108 (90 Base) MCG/ACT inhaler Commonly known as:  VENTOLIN HFA Inhale 2 puffs into the lungs every 6 (six) hours as needed for wheezing or shortness of breath.   CALTRATE 600+D 600-400 MG-UNIT tablet Generic drug:  Calcium Carbonate-Vitamin D Take 1 tablet by mouth daily.   cetirizine 10 MG tablet Commonly known as:  ZYRTEC Take 10 mg by mouth daily.   gabapentin 600 MG tablet Commonly known as:  NEURONTIN Take 1 tablet (600 mg total) by mouth 3 (three) times daily.   levETIRAcetam 500 MG tablet Commonly known as:  KEPPRA Take 2 tablets (1,000 mg total) by mouth 2 (two) times daily.   meloxicam 7.5 MG tablet Commonly known as:  MOBIC Take 1 tablet (7.5 mg total) by mouth daily as needed for  pain.          Objective:   Physical Exam BP 124/68 (BP Location: Left Arm, Patient Position: Sitting, Cuff Size: Small)   Pulse (!) 56   Temp 98.3 F (36.8 C) (Oral)   Resp 14   Ht 5\' 3"  (1.6 m)   Wt 160 lb 2 oz (72.6 kg)   SpO2 96%   BMI 28.36 kg/m  General:   Well developed, NAD, see BMI.  HEENT:  Normocephalic . Face symmetric, atraumatic Lungs:  CTA B Normal respiratory effort, no intercostal retractions, no accessory muscle use. Heart: RRR,  no murmur.  No pretibial edema bilaterally  Skin: Not pale. Not jaundice Neurologic:  alert & oriented X3.  Speech normal, gait appropriate for age and unassisted Psych--  Cognition and judgment appear intact.  Cooperative with normal attention span and concentration.  Behavior appropriate. No anxious or depressed appearing.      Assessment & Plan:   Assessment Depression Hyperlipidemia Asthma Seasonal allergies Vitamin D deficiency Sickle cell trait History of seizures, last SZ ~ 2012, sees neurology LFTs elevated:  --Hep B and C:  (-) 2017 --01-2017: wnl  AMA, alpha 1 antitrypsin, SPEP, iron studies  --Abdominal US  01-2017: fatty liver, one gallbladder polyp( no f/u  per guidelines)  PLAN: Increase LFTs: Since the last visit, work-up was negative.  Rechecking labs. High cholesterol: Stopped Lipitor 01-2017 (d/t LFTs, as a trial), improving diet, check a FLP Seizure disorders: Saw neurology 03-2017, rx to continue Keppra and gabapentin.  Patient is self decreasing # of pills she takes, mostly due to cost, wonders if that is okay ---> rec to d/w neurology; she asked me to RF her SZ meds, I'll RF but at recommend doses unless  cleared by neurology. Social: Patient currently has no insurance, I encouraged her to continue coming for care, the hospital system and practice have ways to  help her financially. RTC 8 months

## 2017-11-08 NOTE — Patient Instructions (Signed)
GO TO THE LAB : Get the blood work     GO TO THE FRONT DESK Schedule your next appointment for a   checkup in 8 months 

## 2017-11-08 NOTE — Progress Notes (Signed)
Pre visit review using our clinic review tool, if applicable. No additional management support is needed unless otherwise documented below in the visit note. 

## 2017-11-09 NOTE — Assessment & Plan Note (Signed)
Increase LFTs: Since the last visit, work-up was negative.  Rechecking labs. High cholesterol: Stopped Lipitor 01-2017 (d/t LFTs, as a trial), improving diet, check a FLP Seizure disorders: Saw neurology 03-2017, rx to continue Keppra and gabapentin.  Patient is self decreasing # of pills she takes, mostly due to cost, wonders if that is okay ---> rec to d/w neurology; she asked me to RF her SZ meds, I'll RF but at recommend doses unless  cleared by neurology. Social: Patient currently has no insurance, I encouraged her to continue coming for care, the hospital system and practice have ways to  help her financially. RTC 8 months

## 2017-11-11 ENCOUNTER — Other Ambulatory Visit: Payer: Self-pay | Admitting: Obstetrics and Gynecology

## 2017-11-11 DIAGNOSIS — Z1231 Encounter for screening mammogram for malignant neoplasm of breast: Secondary | ICD-10-CM

## 2017-11-16 ENCOUNTER — Telehealth: Payer: Self-pay | Admitting: Internal Medicine

## 2017-11-16 MED ORDER — LEVETIRACETAM 500 MG PO TABS: 1000.0000 mg | ORAL_TABLET | Freq: Two times a day (BID) | ORAL | 5 refills | Status: DC

## 2017-11-16 MED ORDER — GABAPENTIN 600 MG PO TABS: 600.0000 mg | ORAL_TABLET | Freq: Three times a day (TID) | ORAL | 1 refills | Status: DC

## 2017-11-16 NOTE — Telephone Encounter (Signed)
Rx[s] printed awaiting MD signature. 

## 2017-11-16 NOTE — Telephone Encounter (Signed)
Spoke w/ Pt- informed Rx's have been placed at front desk for pick up. Pt verbalized understanding.

## 2017-11-16 NOTE — Telephone Encounter (Signed)
Copied from CRM [id number]. Topic: Quick Communication - Rx Refill/Question >> Nov 16, 2017 11:17 AM Maia Pettiesrtiz, Kristie S wrote: Medication: gabapentin (NEURONTIN) 600 MG tablet and levETIRAcetam (KEPPRA) 500 MG tablet Pt would like to pick up hard copy RX of both of these medications so she is able to check with different pharmacies for pricing. Please call when ready for pick up. (pt does not have insurance)

## 2017-11-23 ENCOUNTER — Ambulatory Visit (HOSPITAL_COMMUNITY)
Admission: RE | Admit: 2017-11-23 | Discharge: 2017-11-23 | Disposition: A | Discharge status: Home / Self Care | Payer: Self-pay | Source: Ambulatory Visit | Attending: Obstetrics and Gynecology | Admitting: Obstetrics and Gynecology

## 2017-11-23 ENCOUNTER — Ambulatory Visit
Admission: RE | Admit: 2017-11-23 | Discharge: 2017-11-23 | Disposition: A | Discharge status: Home / Self Care | Payer: No Typology Code available for payment source | Source: Ambulatory Visit | Attending: Obstetrics and Gynecology | Admitting: Obstetrics and Gynecology

## 2017-11-23 ENCOUNTER — Encounter (HOSPITAL_COMMUNITY): Payer: Self-pay

## 2017-11-23 VITALS — BP 114/70 | Wt 159.2 lb

## 2017-11-23 DIAGNOSIS — Z1239 Encounter for other screening for malignant neoplasm of breast: Secondary | ICD-10-CM

## 2017-11-23 DIAGNOSIS — Z1231 Encounter for screening mammogram for malignant neoplasm of breast: Secondary | ICD-10-CM

## 2017-11-23 NOTE — Patient Instructions (Signed)
Explained breast self awareness with Beth Blackwell. Patient did not need a Pap smear today due to patient has a history of a hysterectomy for benign reasons. Let patient know that she doesn't need any further Pap smears due to her history of a hysterectomy for benign reasons. [facility]. Appointment scheduled for Tuesday, November 23, 2017 at 1510. Let patient know the Breast Center will follow up with her within the next couple weeks with results of mammogram by letter or phone. Beth Blackwell verbalized understanding.  Staci Carver, Kathaleen Maserhristine Poll, RN 2:54 PM

## 2017-11-23 NOTE — Progress Notes (Signed)
No complaints today.   Pap Smear: Pap smear not completed today. Last Pap smear was 04/10/2011 at Dr. Fontaine NoFernandez's office and normal. Per patient has no history of an abnormal Pap smear. Patient has a history of a hysterectomy 12/28/2005 due to AUB. Patient no longer needs Pap smears due to her history of a hysterectomy for benign reasons per BCCCP and ACOG guidelines. Last Pap smear and hysterectomy results are in Epic.  Physical exam: Breasts Breasts symmetrical. No skin abnormalities bilateral breasts. No nipple retraction bilateral breasts. No nipple discharge bilateral breasts. No lymphadenopathy. No lumps palpated bilateral breasts. No complaints of pain or tenderness on exam. [facility] for [facility] mamm[facility]. Appointment scheduled for Tuesday, November 23, 2017 at 1510.        Pelvic/Bimanual No Pap smear completed today since patient has a history of a hysterectomy for benign reasons. Pap smear not indicated per BCCCP guidelines.   Smoking History: Patient has never smoked.  Patient Navigation: Patient education provided. Access to services provided for patient through [facility]. Spanish interpreter provided.   Breast and Cervical Cancer Risk Assessment: Patient has no family history of breast cancer, known genetic mutations, or radiation treatment to the chest before age 89. Patient has no history of cervical dysplasia, immunocompromised, or DES exposure in-utero.  Risk Assessment    Risk Scores      11/23/2017   Last edited by: Lynnell DikeHolland, Sabrina H, LPN   5-year risk: 0.3 %   Lifetime risk: 4.6 %         Used Spanish interpreter Natale LayErika McReynolds from [facility].

## 2017-11-24 ENCOUNTER — Other Ambulatory Visit: Payer: Self-pay | Admitting: Obstetrics and Gynecology

## 2017-11-24 DIAGNOSIS — R928 Other abnormal and inconclusive findings on diagnostic imaging of breast: Secondary | ICD-10-CM

## 2017-11-26 ENCOUNTER — Ambulatory Visit
Admission: RE | Admit: 2017-11-26 | Discharge: 2017-11-26 | Disposition: A | Discharge status: Home / Self Care | Payer: No Typology Code available for payment source | Source: Ambulatory Visit | Attending: Obstetrics and Gynecology | Admitting: Obstetrics and Gynecology

## 2017-11-26 DIAGNOSIS — R928 Other abnormal and inconclusive findings on diagnostic imaging of breast: Secondary | ICD-10-CM

## 2017-11-29 ENCOUNTER — Encounter (HOSPITAL_COMMUNITY): Payer: Self-pay | Admitting: *Deleted

## 2017-12-14 ENCOUNTER — Encounter: Payer: BLUE CROSS/BLUE SHIELD | Admitting: Internal Medicine

## 2018-04-15 ENCOUNTER — Ambulatory Visit: Payer: BLUE CROSS/BLUE SHIELD | Admitting: Nurse Practitioner

## 2018-04-25 ENCOUNTER — Encounter: Payer: Self-pay | Admitting: Obstetrics & Gynecology

## 2018-04-25 ENCOUNTER — Ambulatory Visit (INDEPENDENT_AMBULATORY_CARE_PROVIDER_SITE_OTHER): Payer: Self-pay | Admitting: Obstetrics & Gynecology

## 2018-04-25 VITALS — BP 128/84

## 2018-04-25 DIAGNOSIS — N898 Other specified noninflammatory disorders of vagina: Secondary | ICD-10-CM

## 2018-04-25 DIAGNOSIS — B9689 Other specified bacterial agents as the cause of diseases classified elsewhere: Secondary | ICD-10-CM

## 2018-04-25 DIAGNOSIS — N760 Acute vaginitis: Secondary | ICD-10-CM

## 2018-04-25 DIAGNOSIS — Z23 Encounter for immunization: Secondary | ICD-10-CM

## 2018-04-25 LAB — WET PREP FOR TRICH, YEAST, CLUE

## 2018-04-25 MED ORDER — TINIDAZOLE 500 MG PO TABS: 2.0000 g | ORAL_TABLET | Freq: Every day | ORAL | 0 refills | Status: AC

## 2018-04-25 MED ORDER — FLUCONAZOLE 150 MG PO TABS: 150.0000 mg | ORAL_TABLET | Freq: Every day | ORAL | 1 refills | Status: AC

## 2018-04-25 NOTE — Progress Notes (Signed)
    Beth GalloYuli I Blackwell 20-May-1976 [id number]        42 y.o.  G2P2L2 Married  RP: Vaginal discharge with irritation and itching  HPI:  Vaginal discharge with irritation and itching x about a week.  S/P TAH.  Urine/BMs wnl.  Declines STI screen. Tried an OTC vaginal cream without improvement.   OB History  Gravida Para Term Preterm AB Living  2 2       2   SAB TAB Ectopic Multiple Live Births               # Outcome Date GA Lbr Len/2nd Weight Sex Delivery Anes PTL Lv  2 Para      CS-LTranv     1 Para      CS-LTranv       Past medical history,surgical history, problem list, medications, allergies, family history and social history were all reviewed and documented in the EPIC chart.   Directed ROS with pertinent positives and negatives documented in the history of present illness/assessment and plan.  Exam:  Vitals:   04/25/18 1014  BP: 128/84   General appearance:  Normal  Abdomen: Normal  Gynecologic exam: Vulva normal.  Speculum:  Vagina normal.  Increased vaginal discharge.  Wet prep done.  Wet Prep:  Clue cells present   Assessment/Plan:  42 y.o. G2P2   1. Vaginal discharge Clinically yeast vaginitis with bacterial vaginosis per wet prep.  Decision to treat with tinidazole 4 tablets daily for 2 days first, then fluconazole 150 mg 1 tablet daily for 3 days.  Recommend probiotic tablets vaginally once a week for prevention after treatment.  Usage of medications discussed and prescriptions sent to pharmacy. - WET PREP FOR TRICH, YEAST, CLUE  2. Bacterial vaginosis As above.  3. Needs flu shot Flu shot given  Other orders - tinidazole (TINDAMAX) 500 MG tablet; Take 4 tablets (2,000 mg total) by mouth daily for 2 days. - fluconazole (DIFLUCAN) 150 MG tablet; Take 1 tablet (150 mg total) by mouth daily for 3 days.  Counseling on above issues and coordination of care more than 50% for 15 minutes.  Beth DelMarie-Lyne Khiya Friese MD, 10:29 AM 04/25/2018

## 2018-04-25 NOTE — Patient Instructions (Signed)
1. Vaginal discharge Clinically yeast vaginitis with bacterial vaginosis per wet prep.  Decision to treat with tinidazole 4 tablets daily for 2 days first, then fluconazole 150 mg 1 tablet daily for 3 days.  Recommend probiotic tablets vaginally once a week for prevention after treatment.  Usage of medications discussed and prescriptions sent to pharmacy. - WET PREP FOR TRICH, YEAST, CLUE  2. Bacterial vaginosis As above.  3. Needs flu shot Flu shot given  Other orders - tinidazole (TINDAMAX) 500 MG tablet; Take 4 tablets (2,000 mg total) by mouth daily for 2 days. - fluconazole (DIFLUCAN) 150 MG tablet; Take 1 tablet (150 mg total) by mouth daily for 3 days.  Gwynn BurlyYuli, fue un placer verle hoy!

## 2018-04-25 NOTE — Addendum Note (Signed)
Addended by: Berna SpareASTILLO, BLANCA A on: 04/25/2018 12:37 PM   Modules accepted: Orders

## 2018-07-04 ENCOUNTER — Encounter: Payer: Self-pay | Admitting: Obstetrics & Gynecology

## 2018-09-19 ENCOUNTER — Telehealth: Payer: Self-pay

## 2018-09-19 ENCOUNTER — Other Ambulatory Visit: Payer: Self-pay

## 2018-09-19 NOTE — Telephone Encounter (Signed)
Overdue for follow-up, Beth Blackwell can you try contacting Pt to set up virtual visit please?

## 2018-09-20 NOTE — Telephone Encounter (Signed)
LVM for pt to schedule fu VOV appt, pt is due for a fu appt.

## 2018-09-21 ENCOUNTER — Ambulatory Visit (INDEPENDENT_AMBULATORY_CARE_PROVIDER_SITE_OTHER): Payer: Self-pay | Admitting: Obstetrics & Gynecology

## 2018-09-21 ENCOUNTER — Other Ambulatory Visit: Payer: Self-pay

## 2018-09-21 ENCOUNTER — Encounter: Payer: Self-pay | Admitting: Obstetrics & Gynecology

## 2018-09-21 VITALS — BP 126/78 | Ht 62.0 in | Wt 155.0 lb

## 2018-09-21 DIAGNOSIS — E663 Overweight: Secondary | ICD-10-CM

## 2018-09-21 DIAGNOSIS — Z90710 Acquired absence of both cervix and uterus: Secondary | ICD-10-CM

## 2018-09-21 DIAGNOSIS — Z01419 Encounter for gynecological examination (general) (routine) without abnormal findings: Secondary | ICD-10-CM

## 2018-09-21 NOTE — Progress Notes (Signed)
Beth Blackwell 1975/06/10 [id number]   History:    43 y.o. G2P2L2 Married.  Son in 20's.  Daughter 14 yo at home.  RP:  Established patient presenting for annual gyn exam   HPI: S/P TAH 2007 for menorrhagia.  No pelvic pain.  No pain with IC.  Urine/BMs wnl.  Breasts normal.  BMI 28.35.  Health labs with Dr Drue Novel.  Past medical history,surgical history, family history and social history were all reviewed and documented in the EPIC chart.  Gynecologic History No LMP recorded. Patient has had a hysterectomy. Contraception: status post hysterectomy Last Pap: 2012. Results were: normal Last mammogram: 11/2017. Results were: Benign Bone Density: Never Colonoscopy: Never  Obstetric History OB History  Gravida Para Term Preterm AB Living  2 2       2   SAB TAB Ectopic Multiple Live Births               # Outcome Date GA Lbr Len/2nd Weight Sex Delivery Anes PTL Lv  2 Para      CS-LTranv     1 Para      CS-LTranv        ROS: A ROS was performed and pertinent positives and negatives are included in the history.  GENERAL: No fevers or chills. HEENT: No change in vision, no earache, sore throat or sinus congestion. NECK: No pain or stiffness. CARDIOVASCULAR: No chest pain or pressure. No palpitations. PULMONARY: No shortness of breath, cough or wheeze. GASTROINTESTINAL: No abdominal pain, nausea, vomiting or diarrhea, melena or bright red blood per rectum. GENITOURINARY: No urinary frequency, urgency, hesitancy or dysuria. MUSCULOSKELETAL: No joint or muscle pain, no back pain, no recent trauma. DERMATOLOGIC: No rash, no itching, no lesions. ENDOCRINE: No polyuria, polydipsia, no heat or cold intolerance. No recent change in weight. HEMATOLOGICAL: No anemia or easy bruising or bleeding. NEUROLOGIC: No headache, seizures, numbness, tingling or weakness. PSYCHIATRIC: No depression, no loss of interest in normal activity or change in sleep pattern.     Exam:   BP 126/78   Ht 5\' 2"  (1.575 m)    Wt 155 lb (70.3 kg)   BMI 28.35 kg/m   Body mass index is 28.35 kg/m.  General appearance : Well developed well nourished female. No acute distress HEENT: Eyes: no retinal hemorrhage or exudates,  Neck supple, trachea midline, no carotid bruits, no thyroidmegaly Lungs: Clear to auscultation, no rhonchi or wheezes, or rib retractions  Heart: Regular rate and rhythm, no murmurs or gallops Breast:Examined in sitting and supine position were symmetrical in appearance, no palpable masses or tenderness,  no skin retraction, no nipple inversion, no nipple discharge, no skin discoloration, no axillary or supraclavicular lymphadenopathy Abdomen: no palpable masses or tenderness, no rebound or guarding Extremities: no edema or skin discoloration or tenderness  Pelvic: Vulva: Normal             Vagina: No gross lesions or discharge  Cervix/Uterus absent  Adnexa  Without masses or tenderness  Anus: Normal   Assessment/Plan:  44 y.o. female for annual exam   1. Well female exam with routine gynecological exam Gynecologic exam status post TAH.  Pap test in 2012 was negative, patient declines a repeat Pap test this year.  Breast exam normal.  Screening mammogram benign in July 2019.  Health labs with family physician.  2. S/P TAH (total abdominal hysterectomy)  3. Overweight (BMI 25.0-29.9) Recommend a lower calorie/carb diet such as [employer].  Recommend aerobic physical  activities 5 times a week and weightlifting every 2 days.  [name] MD, 8:37 AM 09/21/2018

## 2018-09-22 ENCOUNTER — Encounter: Payer: Self-pay | Admitting: Obstetrics & Gynecology

## 2018-09-22 NOTE — Patient Instructions (Signed)
1. Well female exam with routine gynecological exam Gynecologic exam status post TAH.  Pap test in 2012 was negative, patient declines a repeat Pap test this year.  Breast exam normal.  Screening mammogram benign in July 2019.  Health labs with family physician.  2. S/P TAH (total abdominal hysterectomy)  3. Overweight (BMI 25.0-29.9) Recommend a lower calorie/carb diet such as [employer].  Recommend aerobic physical activities 5 times a week and weightlifting every 2 days.  Gwynn Burly, fue un placer verle hoy!

## 2018-09-29 ENCOUNTER — Other Ambulatory Visit: Payer: Self-pay | Admitting: Internal Medicine

## 2018-12-19 ENCOUNTER — Encounter: Payer: Self-pay | Admitting: Internal Medicine

## 2018-12-26 ENCOUNTER — Other Ambulatory Visit: Payer: Self-pay

## 2018-12-26 ENCOUNTER — Encounter: Payer: Self-pay | Admitting: Internal Medicine

## 2018-12-26 ENCOUNTER — Ambulatory Visit (INDEPENDENT_AMBULATORY_CARE_PROVIDER_SITE_OTHER): Payer: Self-pay | Admitting: Internal Medicine

## 2018-12-26 VITALS — BP 129/78 | HR 68 | Temp 98.5°F | Resp 16 | Ht 62.0 in | Wt 156.4 lb

## 2018-12-26 DIAGNOSIS — Z0000 Encounter for general adult medical examination without abnormal findings: Secondary | ICD-10-CM

## 2018-12-26 DIAGNOSIS — E785 Hyperlipidemia, unspecified: Secondary | ICD-10-CM

## 2018-12-26 DIAGNOSIS — G40909 Epilepsy, unspecified, not intractable, without status epilepticus: Secondary | ICD-10-CM

## 2018-12-26 DIAGNOSIS — E559 Vitamin D deficiency, unspecified: Secondary | ICD-10-CM

## 2018-12-26 MED ORDER — GABAPENTIN 600 MG PO TABS: 600.0000 mg | ORAL_TABLET | Freq: Three times a day (TID) | ORAL | 1 refills | Status: DC

## 2018-12-26 MED ORDER — LEVETIRACETAM 500 MG PO TABS: 1000.0000 mg | ORAL_TABLET | Freq: Two times a day (BID) | ORAL | 1 refills | Status: DC

## 2018-12-26 NOTE — Progress Notes (Signed)
Pre visit review using our clinic review tool, if applicable. No additional management support is needed unless otherwise documented below in the visit note. 

## 2018-12-26 NOTE — Patient Instructions (Signed)
   GO TO THE FRONT DESK Schedule fasting labs this week  Schedule your next appointment  For 1 year

## 2018-12-26 NOTE — Progress Notes (Signed)
Subjective:    Patient ID: Beth Blackwell, female    DOB: Dec 21, 1975, 43 y.o.   MRN: [id number]  DOS:  12/26/2018 Type of visit - description:  ROV Seizure disorder: Good med compliance, needs a refill.  Did have brief seizure few months ago. Dyslipidemia: Not doing well with diet, not exercising much.  Vitamin D deficiency: Good compliance with supplements    Review of Systems Reports a long history of headaches on and off, at baseline, denies unusual features such as neck stiffness or nausea. COVID-19: Good precautions Denies chest pain, shortness of breath, nausea, vomiting, diarrhea No fever chills   Past Medical History:  Diagnosis Date  . Depression   . High cholesterol   . Retained orthopedic hardware 02/2014   right wrist/forearm  . Seasonal allergies   . Seizures (HCC)    no seizures  since ~ 2012  . Sickle cell trait (HCC)   . Varicose vein of leg   . Vitamin D deficiency     Past Surgical History:  Procedure Laterality Date  . ABDOMINAL HYSTERECTOMY  12/28/2005   NO oophorectomy  . APPENDECTOMY    . CESAREAN SECTION     x 2  . HARDWARE REMOVAL Right 03/09/2014   Procedure: REMOVAL PLATE AND SCREW RIGHT ULNAR;  Surgeon: Cindee SaltGary Kuzma, MD;  Location: [facility];  Service: Orthopedics;  Laterality: Right;  . LAPAROSCOPIC LYSIS OF ADHESIONS  09/22/2006   abdominopelvic  . OVARIAN CYST REMOVAL Right 09/22/2006  . TUBAL LIGATION  02/06/2000  . WRIST ARTHROSCOPY WITH ULNA SHORTENING Right 06/25/2010   and TFCC debridement, shrinkage scapholunate ligament    Social History   Socioeconomic History  . Marital status: Married    Spouse name: Not on file  . Number of children: 2  . Years of education: Not on file  . Highest education level: Not on file  Occupational History  . Occupation: Education officer, environmentalcleaning   Social Needs  . Financial resource strain: Not on file  . Food insecurity    Worry: Not on file    Inability: Not on file  . Transportation needs   Medical: Not on file    Non-medical: Not on file  Tobacco Use  . Smoking status: Never Smoker  . Smokeless tobacco: Never Used  Substance and Sexual Activity  . Alcohol use: No    Alcohol/week: 0.0 standard drinks  . Drug use: No  . Sexual activity: Yes    Partners: Male    Birth control/protection: Surgical    Comment: first intercourse at [age], married 25.  Lifestyle  . Physical activity    Days per week: Not on file    Minutes per session: Not on file  . Stress: Not on file  Relationships  . Social Musicianconnections    Talks on phone: Not on file    Gets together: Not on file    Attends religious service: Not on file    Active member of club or organization: Not on file    Attends meetings of clubs or organizations: Not on file    Relationship status: Not on file  . Intimate partner violence    Fear of current or ex partner: Not on file    Emotionally abused: Not on file    Physically abused: Not on file    Forced sexual activity: Not on file  Other Topics Concern  . Not on file  Social History Narrative   Original from [place]   Household-- pt,  husband   2 children (so 31, daughter2001)     Family History  Adopted: Yes  Problem Relation Age of Onset  . Diabetes Father   . Hypertension Father   . Hypertension Sister   . Seizures Neg Hx   . Colon cancer Neg Hx   . Breast cancer Neg Hx      Allergies as of 12/26/2018      Reactions   Acetaminophen Shortness Of Breath      Medication List       Accurate as of December 26, 2018 11:59 PM. If you have any questions, ask your nurse or doctor.        Caltrate 600+D 600-400 MG-UNIT tablet Generic drug: Calcium Carbonate-Vitamin D Take 1 tablet by mouth daily.   cetirizine 10 MG tablet Commonly known as: ZYRTEC Take 10 mg by mouth daily.   cholecalciferol 25 MCG (1000 UT) tablet Commonly known as: VITAMIN D3 Take 1,000 Units by mouth daily.   gabapentin 600 MG tablet Commonly known as: NEURONTIN  Take 1 tablet (600 mg total) by mouth 3 (three) times daily.   levETIRAcetam 500 MG tablet Commonly known as: KEPPRA Take 2 tablets (1,000 mg total) by mouth 2 (two) times daily.           Objective:   Physical Exam BP 129/78 (BP Location: Left Arm, Patient Position: Sitting, Cuff Size: Small)   Pulse 68   Temp 98.5 F (36.9 C) (Oral)   Resp 16   Ht 5\' 2"  (1.575 m)   Wt 156 lb 6 oz (70.9 kg)   SpO2 99%   BMI 28.60 kg/m  General:   Well developed, NAD, BMI noted.  HEENT:  Normocephalic . Face symmetric, atraumatic Lungs:  CTA B Normal respiratory effort, no intercostal retractions, no accessory muscle use. Heart: RRR,  no murmur.  no pretibial edema bilaterally  Abdomen:  Not distended, soft, non-tender. No rebound or rigidity.   Skin: Not pale. Not jaundice Neurologic:  alert & oriented X3.  Speech normal, gait appropriate for age and unassisted .  EOMI. Psych--  Cognition and judgment appear intact.  Cooperative with normal attention span and concentration.  Behavior appropriate. No anxious or depressed appearing.     Assessment    Assessment Depression Hyperlipidemia H/o Asthma Seasonal allergies Vitamin D deficiency Sickle cell trait History of seizures LFTs elevated:  --Hep B and C:  (-) 2017 --01-2017: wnl  AMA, alpha 1 antitrypsin, SPEP, iron studies  --Abdominal US  01-2017: fatty liver, one gallbladder polyp( no f/u  per guidelines)  PLAN: Preventive care reviewed Depression: Not an issue Hyperlipidemia: Needs to improve diet and exercise, counseled.  Will check FLP, CMP, CBC and TSH. Vitamin D deficiency: Checking labs Increased LFTs: Checking labs Seizure disorder: Good med compliance, had a brief seizure 07-2018, the patient does not recall details, but was told by her husband. Plan: RF meds, refer back to neurology due to recent brief seizure.  Adjust meds?  Observation?Marland Kitchen Also recommend to avoid triggers, apparently staying up late as well  as noises trigger the last seizure (she went to a party the night before the seizure) Headaches: On and off, at baseline, watch for increase. RTC 1 year

## 2018-12-27 NOTE — Assessment & Plan Note (Signed)
  Preventive care reviewed Depression: Not an issue Hyperlipidemia: Needs to improve diet and exercise, counseled.  Will check FLP, CMP, CBC and TSH. Vitamin D deficiency: Checking labs Increased LFTs: Checking labs Seizure disorder: Good med compliance, had a brief seizure 07-2018, the patient does not recall details, but was told by her husband. Plan: RF meds, refer back to neurology due to recent brief seizure.  Adjust meds?  Observation?Marland Kitchen Also recommend to avoid triggers, apparently staying up late as well as noises trigger the last seizure (she went to a party the night before the seizure) Headaches: On and off, at baseline, watch for increase. RTC 1 year

## 2018-12-27 NOTE — Assessment & Plan Note (Signed)
Preventive care reviewed -Td 2015 - PNM 23 11-2015 - flu shot strongly recommended  -Female care per gynecology; will see Dr Dellis Filbert;  [facility] 11/2017;  no further PAPs per gyn -CCS: not indicated

## 2018-12-28 ENCOUNTER — Other Ambulatory Visit (INDEPENDENT_AMBULATORY_CARE_PROVIDER_SITE_OTHER): Payer: Self-pay

## 2018-12-28 ENCOUNTER — Other Ambulatory Visit: Payer: Self-pay

## 2018-12-28 DIAGNOSIS — E785 Hyperlipidemia, unspecified: Secondary | ICD-10-CM

## 2018-12-28 DIAGNOSIS — Z0000 Encounter for general adult medical examination without abnormal findings: Secondary | ICD-10-CM

## 2018-12-28 DIAGNOSIS — E559 Vitamin D deficiency, unspecified: Secondary | ICD-10-CM

## 2018-12-28 LAB — COMPREHENSIVE METABOLIC PANEL
ALT: 20 U/L (ref 0–35)
AST: 16 U/L (ref 0–37)
Albumin: 4.6 g/dL (ref 3.5–5.2)
Alkaline Phosphatase: 53 U/L (ref 39–117)
BUN: 8 mg/dL (ref 6–23)
CO2: 28 mEq/L (ref 19–32)
Calcium: 9.4 mg/dL (ref 8.4–10.5)
Chloride: 106 mEq/L (ref 96–112)
Creatinine, Ser: 0.5 mg/dL (ref 0.40–1.20)
GFR: 134.49 mL/min (ref >60.00)
Glucose, Bld: 96 mg/dL (ref 70–99)
Potassium: 3.5 mEq/L (ref 3.5–5.1)
Sodium: 141 mEq/L (ref 135–145)
Total Bilirubin: 0.6 mg/dL (ref 0.2–1.2)
Total Protein: 6.8 g/dL (ref 6.0–8.3)

## 2018-12-28 LAB — LIPID PANEL
Cholesterol: 216 mg/dL — ABNORMAL HIGH (ref 0–200)
HDL: 39 mg/dL — ABNORMAL LOW (ref >39.00)
NonHDL: 177.16
Total CHOL/HDL Ratio: 6
Triglycerides: 249 mg/dL — ABNORMAL HIGH (ref 0.0–149.0)
VLDL: 49.8 mg/dL — ABNORMAL HIGH (ref 0.0–40.0)

## 2018-12-28 LAB — CBC WITH DIFFERENTIAL/PLATELET
Basophils Absolute: 0 10*3/uL (ref 0.0–0.1)
Basophils Relative: 0.6 % (ref 0.0–3.0)
Eosinophils Absolute: 0 10*3/uL (ref 0.0–0.7)
Eosinophils Relative: 0.9 % (ref 0.0–5.0)
HCT: 38.9 % (ref 36.0–46.0)
Hemoglobin: 13 g/dL (ref 12.0–15.0)
Lymphocytes Relative: 32.8 % (ref 12.0–46.0)
Lymphs Abs: 1.6 10*3/uL (ref 0.7–4.0)
MCHC: 33.3 g/dL (ref 30.0–36.0)
MCV: 90 fl (ref 78.0–100.0)
Monocytes Absolute: 0.2 10*3/uL (ref 0.1–1.0)
Monocytes Relative: 4 % (ref 3.0–12.0)
Neutro Abs: 3.1 10*3/uL (ref 1.4–7.7)
Neutrophils Relative %: 61.7 % (ref 43.0–77.0)
Platelets: 234 10*3/uL (ref 150.0–400.0)
RBC: 4.32 Mil/uL (ref 3.87–5.11)
RDW: 12.5 % (ref 11.5–15.5)
WBC: 5 10*3/uL (ref 4.0–10.5)

## 2018-12-28 LAB — TSH: TSH: 2.54 u[IU]/mL (ref 0.35–4.50)

## 2018-12-28 LAB — VITAMIN D 25 HYDROXY (VIT D DEFICIENCY, FRACTURES): VITD: 32.26 ng/mL (ref 30.00–100.00)

## 2018-12-28 LAB — LDL CHOLESTEROL, DIRECT: Direct LDL: 141 mg/dL

## 2019-02-02 ENCOUNTER — Other Ambulatory Visit: Payer: Self-pay | Admitting: Internal Medicine

## 2019-02-02 MED ORDER — LEVETIRACETAM 500 MG PO TABS: 1000.0000 mg | ORAL_TABLET | Freq: Two times a day (BID) | ORAL | 3 refills | Status: DC

## 2019-02-02 MED ORDER — GABAPENTIN 600 MG PO TABS: 600.0000 mg | ORAL_TABLET | Freq: Three times a day (TID) | ORAL | 3 refills | Status: DC

## 2019-02-02 NOTE — Telephone Encounter (Signed)
Refill for levETIRAcetam (KEPPRA) 500 MG tablet  and also gabapentin (NEURONTIN) 600 MG tablet     Pharmacy:  [facility], [address] [phone] (Phone) [phone] (Fax)

## 2019-02-02 NOTE — Telephone Encounter (Signed)
Requested medication (s) are due for refill today: yes  Requested medication (s) are on the active medication list: yes  Last refill:  12/26/2018  Future visit scheduled: no  Notes to clinic:  This refill cannot be delegated    Requested Prescriptions  Pending Prescriptions Disp Refills   levETIRAcetam (KEPPRA) 500 MG tablet 360 tablet 1    Sig: Take 2 tablets (1,000 mg total) by mouth 2 (two) times daily.     Not Delegated - Neurology:  Anticonvulsants Failed - 02/02/2019 11:22 AM      Failed - This refill cannot be delegated      Passed - HCT in normal range and within 360 days    HCT  Date Value Ref Range Status  12/28/2018 38.9 36.0 - 46.0 % Final         Passed - HGB in normal range and within 360 days    Hemoglobin  Date Value Ref Range Status  12/28/2018 13.0 12.0 - 15.0 g/dL Final         Passed - PLT in normal range and within 360 days    Platelets  Date Value Ref Range Status  12/28/2018 234.0 150.0 - 400.0 K/uL Final         Passed - WBC in normal range and within 360 days    WBC  Date Value Ref Range Status  12/28/2018 5.0 4.0 - 10.5 K/uL Final         Passed - Valid encounter within last 12 months    Recent Outpatient Visits          1 month ago Dyslipidemia   [facility] [place], MD   1 year ago Dyslipidemia   [facility] [place], MD   1 year ago Abnormal liver function tests   [place] [facility] [place], MD   2 years ago Annual physical exam   [facility] [place], MD   3 years ago Annual physical exam   [facility] [place], MD              gabapentin (NEURONTIN) 600 MG tablet 270 tablet 1    Sig: Take 1 tablet (600 mg total) by mouth 3 (three) times daily.     Neurology: Anticonvulsants - gabapentin Passed - 02/02/2019  11:22 AM      Passed - Valid encounter within last 12 months    Recent Outpatient Visits          1 month ago Dyslipidemia   [facility] [place], MD   1 year ago Dyslipidemia   [facility] [place], MD   1 year ago Abnormal liver function tests   [place] [facility] [place], MD   2 years ago Annual physical exam   [facility] [place], MD   3 years ago Annual physical exam   [facility] [place], [place]

## 2019-02-16 ENCOUNTER — Other Ambulatory Visit: Payer: Self-pay

## 2019-02-17 ENCOUNTER — Encounter: Payer: Self-pay | Admitting: Obstetrics & Gynecology

## 2019-02-17 ENCOUNTER — Ambulatory Visit (INDEPENDENT_AMBULATORY_CARE_PROVIDER_SITE_OTHER): Payer: Self-pay | Admitting: Obstetrics & Gynecology

## 2019-02-17 VITALS — BP 130/84

## 2019-02-17 DIAGNOSIS — N898 Other specified noninflammatory disorders of vagina: Secondary | ICD-10-CM

## 2019-02-17 DIAGNOSIS — Z23 Encounter for immunization: Secondary | ICD-10-CM

## 2019-02-17 LAB — WET PREP FOR TRICH, YEAST, CLUE

## 2019-02-17 MED ORDER — FLUCONAZOLE 150 MG PO TABS: 150.0000 mg | ORAL_TABLET | Freq: Every day | ORAL | 1 refills | Status: AC

## 2019-02-17 NOTE — Progress Notes (Signed)
    ROSELINA BURGUENO 06/18/1975 [id number]        43 y.o.  G2P2L2 Married  RP: Vaginal discharge with irritation and itching x 4 days  HPI: S/P TAH.  Increased vaginal discharge with irritation and itching for 4 days.  No pelvic pain.  No pain with intercourse.  Declines STI screening.  Urine and bowel movements normal.  No fever.   OB History  Gravida Para Term Preterm AB Living  2 2       2   SAB TAB Ectopic Multiple Live Births               # Outcome Date GA Lbr Len/2nd Weight Sex Delivery Anes PTL Lv  2 Para      CS-LTranv     1 Para      CS-LTranv       Past medical history,surgical history, problem list, medications, allergies, family history and social history were all reviewed and documented in the EPIC chart.   Directed ROS with pertinent positives and negatives documented in the history of present illness/assessment and plan.  Exam:  Vitals:   02/17/19 1027  BP: 130/84   General appearance:  Normal  Gynecologic exam: Vulva normal.  Speculum: Vaginal mucosa normal.  Increased vaginal discharge.  Wet prep done.  Wet prep:  Negative   Assessment/Plan:  43 y.o. G2P2   1. Vaginal discharge Clinically and on exam compatible with yeast vaginitis.  Although wet prep is negative, decision to treat with fluconazole.  Fluconazole 150 mg 1 tablet daily for 3 days prescribed.  Recommend probiotic as needed for prevention. - WET PREP FOR [place], YEAST, CLUE  2. Flu vaccine need Flu shot given.  Other orders - Flu Vaccine QUAD 36+ mos IM (Fluarix, Quad PF) - fluconazole (DIFLUCAN) 150 MG tablet; Take 1 tablet (150 mg total) by mouth daily for 3 days.  Counseling on above issues and coordination of care more than 50% for 15 minutes.  Princess Bruins MD, 10:58 AM 02/17/2019

## 2019-02-17 NOTE — Patient Instructions (Signed)
1. Vaginal discharge Clinically and on exam compatible with yeast vaginitis.  Although wet prep is negative, decision to treat with fluconazole.  Fluconazole 150 mg 1 tablet daily for 3 days prescribed.  Recommend probiotic as needed for prevention. - WET PREP FOR [place], YEAST, CLUE  2. Flu vaccine need Flu shot given.  Other orders - Flu Vaccine QUAD 36+ mos IM (Fluarix, Quad PF) - fluconazole (DIFLUCAN) 150 MG tablet; Take 1 tablet (150 mg total) by mouth daily for 3 days.  Romero Liner, fue un placer verle hoy!

## 2019-07-05 ENCOUNTER — Other Ambulatory Visit: Payer: Self-pay

## 2019-07-05 ENCOUNTER — Encounter: Payer: Self-pay | Admitting: Neurology

## 2019-07-05 ENCOUNTER — Ambulatory Visit: Payer: Self-pay | Admitting: Internal Medicine

## 2019-07-05 ENCOUNTER — Ambulatory Visit: Payer: Self-pay | Admitting: Neurology

## 2019-07-05 VITALS — BP 124/78 | HR 78 | Temp 97.4°F | Ht 62.0 in | Wt 169.0 lb

## 2019-07-05 DIAGNOSIS — G40909 Epilepsy, unspecified, not intractable, without status epilepticus: Secondary | ICD-10-CM

## 2019-07-05 NOTE — Progress Notes (Signed)
.  [id number]

## 2019-07-05 NOTE — Progress Notes (Signed)
PATIENT: Beth Blackwell DOB: [birth year]  REASON FOR VISIT: follow up HISTORY FROM: patient  HISTORY OF PRESENT ILLNESS: Today 07/05/19  Beth Blackwell is a 44 year old female with history of nocturnal seizures.  She has not been seen at this office since November 2018.  She is treated with Keppra and gabapentin.  Her primary doctor has been filling her medications.  Her last seizure occurred in 2011 of record.  She reports in March 2020, she had a seizure, occurred around 4 AM, has sensation that her 3-5th fingers are " moving around", lost consciousness, had full body shaking.  The episode was witnessed by her husband.  She bit her tongue.  Around that time, she had decreased her dose of gabapentin taking 600 mg twice a day, versus 3.  She remains on Keppra, and have been compliant.  She continues to take a lower dose of gabapentin 600 mg twice a day.  She had a seizure last week, as result of forgetting to take her evening dose of Keppra.  It had the same presentation, witnessed by her husband, resulting in her biting her tongue.  Her seizures have always occur nocturnally.  She currently does not have any health insurance.  According to the chart, previous EEG was abnormal in the left temporal area, questionable underlying pathology.  She has had MRI of the brain that was normal.  She presents today for evaluation accompanied by her husband and a [place] interpreter.  HISTORY 04/14/2017 CM: HISTORY OF PRESENT ILLNESS:[name] is a 44 year old right-handed Hispanic female with a history of nocturnal seizure events. She has better  English at this visit and does not need  interpreter. The patient also has infrequent episodes of brief tremors affecting the right hand and arm. This has been going on for  years. The patient has had 2 generalized seizures at nighttime, but she has not had any events since 2011. She is being treated with Keppra and gabapentin, and she is tolerating the medications well.  The patient does operate a motor vehicle without difficulty. She returns to this office for an evaluation. She reports no other new medical issues that have come up since last seen.  She also reports that for her follow-up visit next year she will not have insurance.  She was made aware of since her seizure disorder has been stable she may want to ask her primary care doctor because if he will manage this.  She has an appointment in January with him.  She returns for reevaluation and refills   REVIEW OF SYSTEMS: Out of a complete 14 system review of symptoms, the patient complains only of the following symptoms, and all other reviewed systems are negative.  Seizures  ALLERGIES: Allergies  Allergen Reactions  . Acetaminophen Shortness Of Breath    HOME MEDICATIONS: Outpatient Medications Prior to Visit  Medication Sig Dispense Refill  . Calcium Carbonate-Vitamin D (CALTRATE 600+D) 600-400 MG-UNIT per tablet Take 1 tablet by mouth daily.      . cetirizine (ZYRTEC) 10 MG tablet Take 10 mg by mouth daily.    . cholecalciferol (VITAMIN D3) 25 MCG (1000 UT) tablet Take 1,000 Units by mouth daily.    Marland Kitchen gabapentin (NEURONTIN) 600 MG tablet Take 1 tablet (600 mg total) by mouth 3 (three) times daily. 270 tablet 3  . levETIRAcetam (KEPPRA) 500 MG tablet Take 2 tablets (1,000 mg total) by mouth 2 (two) times daily. 360 tablet 3   No facility-administered medications prior to visit.  PAST MEDICAL HISTORY: Past Medical History:  Diagnosis Date  . Depression   . High cholesterol   . Retained orthopedic hardware 02/2014   right wrist/forearm  . Seasonal allergies   . Seizures ([place])    no seizures  since ~ 2012  . Sickle cell trait ([place])   . Varicose vein of leg   . Vitamin D deficiency     PAST SURGICAL HISTORY: Past Surgical History:  Procedure Laterality Date  . ABDOMINAL HYSTERECTOMY  12/28/2005   NO oophorectomy  . APPENDECTOMY    . CESAREAN SECTION     x 2  . HARDWARE REMOVAL  Right 03/09/2014   Procedure: REMOVAL PLATE AND SCREW RIGHT ULNAR;  Surgeon: Daryll Brod, MD;  Location: [facility];  Service: Orthopedics;  Laterality: Right;  . LAPAROSCOPIC LYSIS OF ADHESIONS  09/22/2006   abdominopelvic  . OVARIAN CYST REMOVAL Right 09/22/2006  . TUBAL LIGATION  02/06/2000  . WRIST ARTHROSCOPY WITH ULNA SHORTENING Right 06/25/2010   and TFCC debridement, shrinkage scapholunate ligament    FAMILY HISTORY: Family History  Adopted: Yes  Problem Relation Age of Onset  . Diabetes Father   . Hypertension Father   . Hypertension Sister   . Seizures Neg Hx   . Colon cancer Neg Hx   . Breast cancer Neg Hx     SOCIAL HISTORY: Social History   Socioeconomic History  . Marital status: Married    Spouse name: Not on file  . Number of children: 2  . Years of education: Not on file  . Highest education level: Not on file  Occupational History  . Occupation: cleaning   Tobacco Use  . Smoking status: Never Smoker  . Smokeless tobacco: Never Used  Substance and Sexual Activity  . Alcohol use: No    Alcohol/week: 0.0 standard drinks  . Drug use: No  . Sexual activity: Yes    Partners: Male    Birth control/protection: Surgical    Comment: first intercourse at 22, married 76.  Other Topics Concern  . Not on file  Social History Narrative   Original from [place]   Household-- pt, husband   2 children (so 1996, daughter2001)   Social Determinants of Health   Financial Resource Strain:   . Difficulty of Paying Living Expenses: Not on file  Food Insecurity:   . Worried About Charity fundraiser in the Last Year: Not on file  . Ran Out of Food in the Last Year: Not on file  Transportation Needs:   . Lack of Transportation (Medical): Not on file  . Lack of Transportation (Non-Medical): Not on file  Physical Activity:   . Days of Exercise per Week: Not on file  . Minutes of Exercise per Session: Not on file  Stress:   . Feeling of Stress :  Not on file  Social Connections:   . Frequency of Communication with Friends and Family: Not on file  . Frequency of Social Gatherings with Friends and Family: Not on file  . Attends Religious Services: Not on file  . Active Member of Clubs or Organizations: Not on file  . Attends Archivist Meetings: Not on file  . Marital Status: Not on file  Intimate Partner Violence:   . Fear of Current or Ex-Partner: Not on file  . Emotionally Abused: Not on file  . Physically Abused: Not on file  . Sexually Abused: Not on file   PHYSICAL EXAM  Vitals:   07/05/19  1404  BP: 124/78  Pulse: 78  Temp: (!) 97.4 F (36.3 C)  TempSrc: Oral  Weight: 169 lb (76.7 kg)  Height: '5\' 2"'  (1.575 m)   Body mass index is 30.91 kg/m.  Generalized: Well developed, in no acute distress   Neurological examination  Mentation: Alert oriented to time, place, history taking. Follows all commands speech and language fluent, speaks some English, visit was assisted by interpreter Cranial nerve II-XII: Pupils were equal round reactive to light. Extraocular movements were full, visual field were full on confrontational test. Facial sensation and strength were normal. Head turning and shoulder shrug  were normal and symmetric. Motor: The motor testing reveals 5 over 5 strength of all 4 extremities. Good symmetric motor tone is noted throughout.  Sensory: Sensory testing is intact to soft touch on all 4 extremities. No evidence of extinction is noted.  Coordination: Cerebellar testing reveals good finger-nose-finger and heel-to-shin bilaterally.  Gait and station: Gait is normal. Tandem gait is normal. Romberg is negative. No drift is seen.  Reflexes: Deep tendon reflexes are symmetric and normal bilaterally.   DIAGNOSTIC DATA (LABS, IMAGING, TESTING) - I reviewed patient records, labs, notes, testing and imaging myself where available.  Lab Results  Component Value Date   WBC 5.0 12/28/2018   HGB 13.0  12/28/2018   HCT 38.9 12/28/2018   MCV 90.0 12/28/2018   PLT 234.0 12/28/2018      Component Value Date/Time   NA 141 12/28/2018 0734   K 3.5 12/28/2018 0734   CL 106 12/28/2018 0734   CO2 28 12/28/2018 0734   GLUCOSE 96 12/28/2018 0734   BUN 8 12/28/2018 0734   CREATININE 0.50 12/28/2018 0734   CALCIUM 9.4 12/28/2018 0734   PROT 6.8 12/28/2018 0734   ALBUMIN 4.6 12/28/2018 0734   AST 16 12/28/2018 0734   ALT 20 12/28/2018 0734   ALKPHOS 53 12/28/2018 0734   BILITOT 0.6 12/28/2018 0734   GFRNONAA >60 09/21/2009 0755   GFRAA  09/21/2009 0755    >60        The eGFR has been calculated using the MDRD equation. This calculation has not been validated in all clinical situations. eGFR's persistently <60 mL/min signify possible Chronic Kidney Disease.   Lab Results  Component Value Date   CHOL 216 (H) 12/28/2018   HDL 39.00 (L) 12/28/2018   LDLCALC 109 (H) 06/07/2014   LDLDIRECT 141.0 12/28/2018   TRIG 249.0 (H) 12/28/2018   CHOLHDL 6 12/28/2018   Lab Results  Component Value Date   HGBA1C 5.5 07/06/2012   No results found for: VITAMINB12 Lab Results  Component Value Date   TSH 2.54 12/28/2018      ASSESSMENT AND PLAN 44 y.o. year old female  has a past medical history of Depression, High cholesterol, Retained orthopedic hardware (02/2014), Seasonal allergies, Seizures ([place]), Sickle cell trait ([facility] [place]), Varicose vein of leg, and Vitamin D deficiency. here with:  1.  Seizures, recent seizure last week in the setting of missed evening medication dose, March 2020 around the time of decreasing gabapentin, prior was in 2011  She has not been seen at this office since 2018.  Her seizures have been well controlled.  She had a seizure in March 2020, around the time she decreased her dose of gabapentin to 600 mg twice a day.  She had a seizure last week, after forgetting to take her evening dose of Keppra.  She always has nocturnal seizures.  The seizure presentation has been  her  typical pattern.  I will check a Keppra level today.  If not therapeutic, we could go up on the medication.  I have encouraged her to resume her prescribed dose of gabapentin which is 600 mg 3 times a day (there was no side effect prompting dose reduction).  Continue taking Keppra 500 mg tablet, 2 tablets twice daily. She does not have health insurance, I have given her paperwork to fill out for [facility] financial assistance.  We may consider a repeat EEG in the future.  Her recent seizures, seem likely to have occurred in the setting of medication noncompliance.  Review of the chart indicates EEG years ago showed abnormality in the left temporal area, questionable underlying pathology.  MRI of the brain has been normal.  She will follow-up in 3 months or sooner if needed.  She will call for recurrent seizure.   I spent 15 minutes with the patient. 50% of this time was spent discussing her plan of care.   Butler Denmark, AGNP-C, DNP 07/05/2019, 2:24 PM [facility] [address] [place], [place] [address] [phone]

## 2019-07-05 NOTE — Patient Instructions (Signed)
I will check a Keppra level today. For now, continue Keppra and Gabapentin at current doses. I will see you back in 3 months.

## 2019-07-07 LAB — LEVETIRACETAM LEVEL: Levetiracetam Lvl: 8.1 ug/mL — ABNORMAL LOW (ref 10.0–40.0)

## 2019-07-10 ENCOUNTER — Other Ambulatory Visit: Payer: Self-pay | Admitting: Neurology

## 2019-07-10 MED ORDER — LEVETIRACETAM 500 MG PO TABS: 1500.0000 mg | ORAL_TABLET | Freq: Two times a day (BID) | ORAL | 5 refills | Status: DC

## 2019-07-10 NOTE — Telephone Encounter (Signed)
Please call the patient.  Her recent Keppra level came back low at 8.1.  Please ensure she is actually taking the medication, and that she had taken the medication on the day of the blood draw.  If indeed she has been taking the medication, we should increase to 1500 mg twice a day, she is currently taking 1000 mg twice a day. We will go up by 500 mg every 2 weeks.

## 2019-07-10 NOTE — Addendum Note (Signed)
Addended by: Guy Begin on: 07/10/2019 11:43 AM   Modules accepted: Orders

## 2019-07-10 NOTE — Telephone Encounter (Signed)
I spoke to pt via spanish interpreter [place] ID [phone]. I relayed the message to pt.  She has taken the levetiracetam 1000mg  po bid as ordered on day of lab.  (only day she did not take was when she had seizure).  I relayed that since she had taken her medication as ordered when she had lab done, SS/NP wanted to increase her dose to levetiracetam 1500mg  po bid.  She has had no seizures.  Will send in new prescription to [facility].  She is to call back if questions/concerns.  She verbalized understanding.

## 2019-08-18 ENCOUNTER — Ambulatory Visit: Payer: Self-pay | Admitting: Obstetrics & Gynecology

## 2019-08-24 ENCOUNTER — Other Ambulatory Visit: Payer: Self-pay

## 2019-08-24 ENCOUNTER — Ambulatory Visit (INDEPENDENT_AMBULATORY_CARE_PROVIDER_SITE_OTHER): Payer: Self-pay | Admitting: Internal Medicine

## 2019-08-24 ENCOUNTER — Encounter: Payer: Self-pay | Admitting: Internal Medicine

## 2019-08-24 VITALS — BP 129/67 | HR 58 | Temp 97.5°F | Resp 16 | Ht 62.0 in | Wt 163.5 lb

## 2019-08-24 DIAGNOSIS — J029 Acute pharyngitis, unspecified: Secondary | ICD-10-CM

## 2019-08-24 DIAGNOSIS — M7711 Lateral epicondylitis, right elbow: Secondary | ICD-10-CM

## 2019-08-24 NOTE — Patient Instructions (Signed)
Use the tennis elbow brace daily when working.  Continue using the Voltaren cream to 3 times a day  Ice the elbow at the end of the day  Call for sports medicine   referral if you are not improving in the next 3 weeks   Codo de [place] Tennis Elbow El codo de tenista es la hinchazn (inflamacin) en la zona externa del Joycie Peek, cerca del codo. La hinchazn afecta los tejidos que conectan el msculo al hueso (tendones). Esta afeccin puede presentarse si practica un deporte o realiza una tarea que exige el uso excesivo del codo. La causa del codo de [place] es la repeticin del mismo movimiento una y Elmon Kirschner. El codo de [place] puede causar lo siguiente:  Dolor y sensibilidad a la palpacin en el Management consultant y la parte externa del codo. Puede sentir dolor todo Physiological scientist o solo cuando utiliza el brazo.  Sensacin de ardor. Esta se extiende desde el codo hasta el brazo.  Agarre dbil en la mano. Siga estas instrucciones en su casa: Actividad  Descanse el codo y la [place]. Evite las actividades que causen [place], como se lo haya indicado el mdico.  Si se lo indica el mdico, use una correa para codo para reducir la presin sobre la zona.  Haga los ejercicios de fisioterapia como se lo hayan indicado.  Si levanta un objeto, hgalo con la palma de la mano [place] arriba. [employer], se reduce el esfuerzo en el codo. Estilo de vida  Si el codo de [place] se debe a los deportes, revise el equipo y Chief Strategy Officer de lo siguiente: ? Que lo [place] correctamente. ? Que es apto para usted.  Si el codo de [place] se debe a su trabajo o al uso de una computadora, tmese descansos con frecuencia para Publishing rights manager. Hable con su jefe de personal sobre cmo puede controlar su afeccin en el trabajo. Si tiene un dispositivo ortopdico:  selo como se lo haya indicado el mdico. Quteselo solamente como se lo haya indicado el mdico.  Afloje el dispositivo ortopdico si los dedos se le adormecen,  siente hormigueos o se le enfran y se tornan de Optician, dispensing.  Mantenga limpio el dispositivo ortopdico.  Si el dispositivo ortopdico no es impermeable, consulte a su mdico si puede quitarse el dispositivo para baarse. Si debe tener puesto el dispositivo ortopdico mientras se baa: ? No deje que se moje. ? Cbralo con un envoltorio hermtico cuando tome un bao de inmersin o [place]. Instrucciones generales   Si se lo indican, aplique hielo sobre la zona dolorida: ? Field seismologist hielo en una bolsa plstica. ? Coloque una [employer] piel y Therapist, nutritional. ? Coloque el hielo durante 20 minutos, 2 a 3 veces por da.  Tome los medicamentos de venta libre y los recetados solamente como se lo haya indicado el mdico.  Consulting civil engineer a todas las visitas de seguimiento como se lo haya indicado el mdico. Esto es importante. Comunquese con un mdico si:  El dolor no mejora con Dispensing optician.  El dolor [place].  Tiene debilidad en el antebrazo, la mano o los dedos de la [place].  No puede sentir el antebrazo, la mano o los dedos de la [place]. Resumen  El codo de tenista es la hinchazn (inflamacin) en la zona externa del Joycie Peek, cerca del codo.  Su causa es la repeticin del mismo movimiento una y Elmon Kirschner.  Descanse el codo y la [place]. Evite las [employer] causen  problemas, como se lo haya indicado el mdico.  Si se lo indican, aplique hielo sobre la zona dolorida durante 20 minutos, de 2 a 3 veces por da. Esta informacin no tiene Theme park manager el consejo del mdico. Asegrese de hacerle al mdico cualquier pregunta que tenga. Document Revised: 03/11/2018 Document Reviewed: 03/11/2018 Elsevier Patient Education  2020 [employer].

## 2019-08-24 NOTE — Progress Notes (Signed)
Subjective:    Patient ID: Beth Blackwell, female    DOB: 1976/05/11, 44 y.o.   MRN: [id number]  DOS:  08/24/2019 Type of visit - description: Acute  Several months history of right elbow pain at the lateral epicondyle, worse at night, worse when she uses her arms a lot. Went to a Restaurant manager, fast food, DX tennis elbow, prescribing some exercises but is not better. She is also using occasionally Voltaren gel and even has a brace but not using it consistently.  Also, itchy/sore throat for 3 days, mostly when she swallows.  Review of Systems Denies fever chills No cough No nausea or vomiting No myalgias  Past Medical History:  Diagnosis Date  . Depression   . High cholesterol   . Retained orthopedic hardware 02/2014   right wrist/forearm  . Seasonal allergies   . Seizures ([place])    no seizures  since ~ 2012  . Sickle cell trait ([place])   . Varicose vein of leg   . Vitamin D deficiency     Past Surgical History:  Procedure Laterality Date  . ABDOMINAL HYSTERECTOMY  12/28/2005   NO oophorectomy  . APPENDECTOMY    . CESAREAN SECTION     x 2  . HARDWARE REMOVAL Right 03/09/2014   Procedure: REMOVAL PLATE AND SCREW RIGHT ULNAR;  Surgeon: Daryll Brod, MD;  Location: [facility];  Service: Orthopedics;  Laterality: Right;  . LAPAROSCOPIC LYSIS OF ADHESIONS  09/22/2006   abdominopelvic  . OVARIAN CYST REMOVAL Right 09/22/2006  . TUBAL LIGATION  02/06/2000  . WRIST ARTHROSCOPY WITH ULNA SHORTENING Right 06/25/2010   and TFCC debridement, shrinkage scapholunate ligament    Allergies as of 08/24/2019      Reactions   Acetaminophen Shortness Of Breath      Medication List       Accurate as of August 24, 2019 11:59 PM. If you have any questions, ask your nurse or doctor.        Caltrate 600+D 600-400 MG-UNIT tablet Generic drug: Calcium Carbonate-Vitamin D Take 1 tablet by mouth daily.   cetirizine 10 MG tablet Commonly known as: ZYRTEC Take 10 mg by mouth daily.     cholecalciferol 25 MCG (1000 UNIT) tablet Commonly known as: VITAMIN D3 Take 1,000 Units by mouth daily.   gabapentin 600 MG tablet Commonly known as: NEURONTIN Take 1 tablet (600 mg total) by mouth 3 (three) times daily.   levETIRAcetam 500 MG tablet Commonly known as: KEPPRA Take 3 tablets (1,500 mg total) by mouth 2 (two) times daily. Take 2 tablets in the morning, take 3 at bedtime x 2 weeks, then take 3 tablets twice daily (stay at this dose)          Objective:   Physical Exam BP 129/67 (BP Location: Left Arm, Patient Position: Sitting, Cuff Size: Normal)   Pulse (!) 58   Temp (!) 97.5 F (36.4 C) (Temporal)   Resp 16   Ht 5\' 2"  (1.575 m)   Wt 163 lb 8 oz (74.2 kg)   SpO2 99%   BMI 29.90 kg/m  General:   Well developed, NAD, BMI noted. HEENT:  Normocephalic . Face symmetric, atraumatic Throat: Symmetric, not red, no white patches, tonsils small. Nose is slightly congested Lungs:  CTA B Normal respiratory effort, no intercostal retractions, no accessory muscle use. Heart: RRR,  no murmur.  MSK: L Elbow normal Right elbow: Range of motion normal, normal to inspection and palpation except for some tenderness at the  lateral epicondyle. Skin: Not pale. Not jaundice Neurologic:  alert & oriented X3.  Speech normal, gait appropriate for age and unassisted Psych--  Cognition and judgment appear intact.  Cooperative with normal attention span and concentration.  Behavior appropriate. No anxious or depressed appearing.      Assessment     Assessment Depression Hyperlipidemia H/o Asthma Seasonal allergies Vitamin D deficiency Sickle cell trait History of seizures LFTs elevated:  --Hep B and C:  (-) 2017 --01-2017: wnl  AMA, alpha 1 antitrypsin, SPEP, iron studies  --Abdominal US  01-2017: fatty liver, one gallbladder polyp( no f/u  per guidelines)  PLAN: Tennis elbow (R): sxs c/w R tennis elbow, minimal symptoms on the left.  Explained to patient what  the diagnosis is, recommend consistent use of Voltaren gel to 3 times a day, consistent use of brace, ice at the end of the day, sports medicine referral if no better Sore throat: No evidence of infection on physical exam, no symptoms, admits to some nasal congestion, symptoms get better with Flonase.  Recommend consistent use of Flonase for the next several days, this is allergy season. Also if she develop other symptoms such as fever, chills, cough needs to be tested for Covid and let us know. Doubt he has infection at this point. Follow-up already scheduled for 12-2019   This visit occurred during the SARS-CoV-2 public health emergency.  Safety protocols were in place, including screening questions prior to the visit, additional usage of staff PPE, and extensive cleaning of exam room while observing appropriate contact time as indicated for disinfecting solutions.

## 2019-08-24 NOTE — Progress Notes (Signed)
Pre visit review using our clinic review tool, if applicable. No additional management support is needed unless otherwise documented below in the visit note. 

## 2019-08-27 NOTE — Assessment & Plan Note (Signed)
Tennis elbow (R): sxs c/w R tennis elbow, minimal symptoms on the left.  Explained to patient what the diagnosis is, recommend consistent use of Voltaren gel to 3 times a day, consistent use of brace, ice at the end of the day, sports medicine referral if no better Sore throat: No evidence of infection on physical exam, no symptoms, admits to some nasal congestion, symptoms get better with Flonase.  Recommend consistent use of Flonase for the next several days, this is allergy season. Also if she develop other symptoms such as fever, chills, cough needs to be tested for Covid and let us know. Doubt he has infection at this point. Follow-up already scheduled for 12-2019

## 2020-01-02 ENCOUNTER — Encounter: Payer: Self-pay | Admitting: Internal Medicine

## 2020-01-08 ENCOUNTER — Encounter: Payer: Self-pay | Admitting: Internal Medicine

## 2020-01-08 ENCOUNTER — Other Ambulatory Visit: Payer: Self-pay

## 2020-01-08 ENCOUNTER — Ambulatory Visit (INDEPENDENT_AMBULATORY_CARE_PROVIDER_SITE_OTHER): Payer: Self-pay | Admitting: Internal Medicine

## 2020-01-08 VITALS — BP 132/80 | HR 67 | Temp 98.1°F | Resp 16 | Ht 62.0 in | Wt 164.5 lb

## 2020-01-08 DIAGNOSIS — E785 Hyperlipidemia, unspecified: Secondary | ICD-10-CM

## 2020-01-08 DIAGNOSIS — E559 Vitamin D deficiency, unspecified: Secondary | ICD-10-CM

## 2020-01-08 DIAGNOSIS — Z1231 Encounter for screening mammogram for malignant neoplasm of breast: Secondary | ICD-10-CM

## 2020-01-08 DIAGNOSIS — Z0000 Encounter for general adult medical examination without abnormal findings: Secondary | ICD-10-CM

## 2020-01-08 DIAGNOSIS — M79641 Pain in right hand: Secondary | ICD-10-CM

## 2020-01-08 NOTE — Progress Notes (Signed)
Subjective:    Patient ID: Beth Blackwell, female    DOB: December 21, 1975, 44 y.o.   MRN: [id number]  DOS:  01/08/2020 Type of visit - description: CPX In general feels well. Was seen 08/24/2019 with sx c/w right tennis elbow, today she reports again pain at the right upper extremity, this time more distally near the wrist. Reports  had surgery near the wrist  few years ago, she also have shakiness at the fourth and fifth right digits.    Review of Systems  Other than above, a 14 point review of systems is negative      Past Medical History:  Diagnosis Date  . Depression   . High cholesterol   . Retained orthopedic hardware 02/2014   right wrist/forearm  . Seasonal allergies   . Seizures (HCC)    no seizures  since ~ 2012  . Sickle cell trait (HCC)   . Varicose vein of leg   . Vitamin D deficiency     Past Surgical History:  Procedure Laterality Date  . ABDOMINAL HYSTERECTOMY  12/28/2005   NO oophorectomy  . APPENDECTOMY    . CESAREAN SECTION     x 2  . HARDWARE REMOVAL Right 03/09/2014   Procedure: REMOVAL PLATE AND SCREW RIGHT ULNAR;  Surgeon: Cindee Salt, MD;  Location: [facility];  Service: Orthopedics;  Laterality: Right;  . LAPAROSCOPIC LYSIS OF ADHESIONS  09/22/2006   abdominopelvic  . OVARIAN CYST REMOVAL Right 09/22/2006  . TUBAL LIGATION  02/06/2000  . WRIST ARTHROSCOPY WITH ULNA SHORTENING Right 06/25/2010   and TFCC debridement, shrinkage scapholunate ligament    Allergies as of 01/08/2020      Reactions   Acetaminophen Shortness Of Breath      Medication List       Accurate as of January 08, 2020 11:59 PM. If you have any questions, ask your nurse or doctor.        Caltrate 600+D 600-400 MG-UNIT tablet Generic drug: Calcium Carbonate-Vitamin D Take 1 tablet by mouth daily.   cetirizine 10 MG tablet Commonly known as: ZYRTEC Take 10 mg by mouth daily.   cholecalciferol 25 MCG (1000 UNIT) tablet Commonly known as: VITAMIN D3 Take 1,000  Units by mouth daily.   gabapentin 600 MG tablet Commonly known as: NEURONTIN Take 1 tablet (600 mg total) by mouth 3 (three) times daily.   levETIRAcetam 500 MG tablet Commonly known as: KEPPRA Take 3 tablets (1,500 mg total) by mouth 2 (two) times daily. Take 2 tablets in the morning, take 3 at bedtime x 2 weeks, then take 3 tablets twice daily (stay at this dose)          Objective:   Physical Exam BP 132/80 (BP Location: Left Arm, Patient Position: Sitting, Cuff Size: Small)   Pulse 67   Temp 98.1 F (36.7 C) (Oral)   Resp 16   Ht 5\' 2"  (1.575 m)   Wt 164 lb 8 oz (74.6 kg)   SpO2 97%   BMI 30.09 kg/m  General: Well developed, NAD, BMI noted Neck: No  thyromegaly  HEENT:  Normocephalic . Face symmetric, atraumatic Lungs:  CTA B Normal respiratory effort, no intercostal retractions, no accessory muscle use. Heart: RRR,  no murmur.  Abdomen:  Not distended, soft, non-tender. No rebound or rigidity.   Lower extremities: no pretibial edema bilaterally  Skin: Exposed areas without rash. Not pale. Not jaundice Neurologic:  alert & oriented X3.  Speech normal, gait appropriate for  age and unassisted Strength symmetric and appropriate for age.  Psych: Cognition and judgment appear intact.  Cooperative with normal attention span and concentration.  Behavior appropriate. No anxious or depressed appearing.     Assessment    Assessment H/o Depression Hyperlipidemia H/o Asthma Seasonal allergies Vitamin D deficiency Sickle cell trait History of seizures LFTs elevated:  --Hep B and C:  (-) 2017 --01-2017: wnl  AMA, alpha 1 antitrypsin, SPEP, iron studies  --Abdominal US  01-2017: fatty liver, one gallbladder polyp( no f/u  per guidelines)  PLAN: Here for CPX History of dyslipidemia: Checking labs. Vitamin D deficiency: On oral supplements, checking labs. Seizures: History of, on Keppra, sees neurology. Right upper extremity pain: Had surgery near the right  wrist 2012, having some shakiness of the fourth and fifth digits and tenderness at the wrist area, worse lately, will send her back to hand surgery. RTC 1 year.  This visit occurred during the SARS-CoV-2 public health emergency.  Safety protocols were in place, including screening questions prior to the visit, additional usage of staff PPE, and extensive cleaning of exam room while observing appropriate contact time as indicated for disinfecting solutions.

## 2020-01-08 NOTE — Patient Instructions (Addendum)
   GO TO THE LAB : Get the blood work     GO TO THE FRONT DESK, PLEASE SCHEDULE YOUR APPOINTMENTS Come back a physical exam in 1 year  STOP BY THE FIRST FLOOR: Schedule your mammogram

## 2020-01-08 NOTE — Progress Notes (Signed)
Pre visit review using our clinic review tool, if applicable. No additional management support is needed unless otherwise documented below in the visit note. 

## 2020-01-09 ENCOUNTER — Encounter: Payer: Self-pay | Admitting: Internal Medicine

## 2020-01-09 LAB — COMPREHENSIVE METABOLIC PANEL
ALT: 26 U/L (ref 0–35)
AST: 17 U/L (ref 0–37)
Albumin: 4.6 g/dL (ref 3.5–5.2)
Alkaline Phosphatase: 50 U/L (ref 39–117)
BUN: 12 mg/dL (ref 6–23)
CO2: 26 mEq/L (ref 19–32)
Calcium: 9.8 mg/dL (ref 8.4–10.5)
Chloride: 105 mEq/L (ref 96–112)
Creatinine, Ser: 0.56 mg/dL (ref 0.40–1.20)
GFR: 117.44 mL/min (ref >60.00)
Glucose, Bld: 89 mg/dL (ref 70–99)
Potassium: 3.6 mEq/L (ref 3.5–5.1)
Sodium: 139 mEq/L (ref 135–145)
Total Bilirubin: 0.5 mg/dL (ref 0.2–1.2)
Total Protein: 6.9 g/dL (ref 6.0–8.3)

## 2020-01-09 LAB — CBC WITH DIFFERENTIAL/PLATELET
Basophils Absolute: 0.1 10*3/uL (ref 0.0–0.1)
Basophils Relative: 1 % (ref 0.0–3.0)
Eosinophils Absolute: 0 10*3/uL (ref 0.0–0.7)
Eosinophils Relative: 0.6 % (ref 0.0–5.0)
HCT: 36.9 % (ref 36.0–46.0)
Hemoglobin: 12.5 g/dL (ref 12.0–15.0)
Lymphocytes Relative: 36.7 % (ref 12.0–46.0)
Lymphs Abs: 2 10*3/uL (ref 0.7–4.0)
MCHC: 34 g/dL (ref 30.0–36.0)
MCV: 89.9 fl (ref 78.0–100.0)
Monocytes Absolute: 0.2 10*3/uL (ref 0.1–1.0)
Monocytes Relative: 3.9 % (ref 3.0–12.0)
Neutro Abs: 3.1 10*3/uL (ref 1.4–7.7)
Neutrophils Relative %: 57.8 % (ref 43.0–77.0)
Platelets: 215 10*3/uL (ref 150.0–400.0)
RBC: 4.11 Mil/uL (ref 3.87–5.11)
RDW: 12.6 % (ref 11.5–15.5)
WBC: 5.3 10*3/uL (ref 4.0–10.5)

## 2020-01-09 LAB — LIPID PANEL
Cholesterol: 235 mg/dL — ABNORMAL HIGH (ref 0–200)
HDL: 43.6 mg/dL (ref >39.00)
LDL Cholesterol: 159 mg/dL — ABNORMAL HIGH (ref 0–99)
NonHDL: 191.37
Total CHOL/HDL Ratio: 5
Triglycerides: 162 mg/dL — ABNORMAL HIGH (ref 0.0–149.0)
VLDL: 32.4 mg/dL (ref 0.0–40.0)

## 2020-01-09 LAB — VITAMIN D 25 HYDROXY (VIT D DEFICIENCY, FRACTURES): VITD: 28.46 ng/mL — ABNORMAL LOW (ref 30.00–100.00)

## 2020-01-09 NOTE — Assessment & Plan Note (Addendum)
Here for CPX History of dyslipidemia: Checking labs. Vitamin D deficiency: On oral supplements, checking labs. Seizures: History of, on Keppra, sees neurology. Right upper extremity pain: Had surgery near the right wrist 2012, having some shakiness of the fourth and fifth digits and tenderness at the wrist area, worse lately, will send her back to hand surgery. RTC 1 year.

## 2020-01-09 NOTE — Assessment & Plan Note (Signed)
-  Td 2015 - PNM 23: 11-2015 -Covid vaccination : hesitant strongly recommend to proceed, pro >> cons - flu shot strongly rec q. year -Female care per gynecology;  Dr Seymour Bars; s/p hysterectomy, no oophorectomy, no further paps.   MMG 2019, ordering a  MMG. -CCS: not indicated --Labs: CMP, FLP, CBC, vitamin D -Diet and exercise discussed

## 2020-01-11 MED ORDER — VITAMIN D (ERGOCALCIFEROL) 1.25 MG (50000 UNIT) PO CAPS: 50000.0000 [IU] | ORAL_CAPSULE | ORAL | 0 refills | Status: DC

## 2020-01-11 NOTE — Addendum Note (Signed)
Addended byConrad [place] D on: 01/11/2020 03:39 PM   Modules accepted: Orders

## 2020-01-12 ENCOUNTER — Ambulatory Visit (HOSPITAL_BASED_OUTPATIENT_CLINIC_OR_DEPARTMENT_OTHER)
Admission: RE | Admit: 2020-01-12 | Discharge: 2020-01-12 | Disposition: A | Discharge status: Home / Self Care | Payer: Self-pay | Source: Ambulatory Visit | Attending: Internal Medicine | Admitting: Internal Medicine

## 2020-01-12 ENCOUNTER — Other Ambulatory Visit: Payer: Self-pay

## 2020-01-12 DIAGNOSIS — Z1231 Encounter for screening mammogram for malignant neoplasm of breast: Secondary | ICD-10-CM | POA: Insufficient documentation

## 2020-03-27 ENCOUNTER — Other Ambulatory Visit: Payer: Self-pay | Admitting: Internal Medicine

## 2020-07-29 ENCOUNTER — Other Ambulatory Visit: Payer: Self-pay | Admitting: Emergency Medicine

## 2020-07-29 MED ORDER — LEVETIRACETAM 500 MG PO TABS: 1500.0000 mg | ORAL_TABLET | Freq: Two times a day (BID) | ORAL | 0 refills | Status: DC

## 2020-07-30 ENCOUNTER — Telehealth: Payer: Self-pay | Admitting: Internal Medicine

## 2020-07-30 ENCOUNTER — Telehealth: Payer: Self-pay | Admitting: Neurology

## 2020-07-30 MED ORDER — LEVETIRACETAM 500 MG PO TABS: 1500.0000 mg | ORAL_TABLET | Freq: Two times a day (BID) | ORAL | 0 refills | Status: DC

## 2020-07-30 NOTE — Telephone Encounter (Signed)
This is handled by neurology (Dr. Anne Hahn)- she will need to call their office for refills.

## 2020-07-30 NOTE — Telephone Encounter (Signed)
[facility] (Deanne0 called,She is out of levETIRAcetam (KEPPRA) 500 MG tablet. She has a appt on Monday, 08/05/20. She at least need Keppra until Monday. Would like a call from the nurse.

## 2020-07-30 NOTE — Addendum Note (Signed)
Addended by: Guy Begin on: 07/30/2020 01:18 PM   Modules accepted: Orders

## 2020-07-30 NOTE — Telephone Encounter (Signed)
Medication: levETIRAcetam (KEPPRA) 500 MG tablet [[id number]]      Has the patient contacted their pharmacy? no (If no, request that the patient contact the pharmacy for the refill.) (If yes, when and what did the pharmacy advise?)    Preferred Pharmacy (with phone number or street name):  [facility] [address] [place], [place] - [address] Phone:  [phone]  Fax:  [phone]          Agent: Please be advised that RX refills may take up to 3 business days. We ask that you follow-up with your pharmacy.

## 2020-07-30 NOTE — Telephone Encounter (Signed)
Pt has scheduled her annual f/u, she is now asking for a refill on her levETIRAcetam (KEPPRA) 500 MG tablet  [facility] [phone]

## 2020-07-30 NOTE — Telephone Encounter (Signed)
Done for 30 days  

## 2020-07-30 NOTE — Telephone Encounter (Signed)
Pt.'s daughter Regan Rakers is not on DPR. She states pharmacy states they have not received fax for levETIRAcetam (KEPPRA) 500 MG tablet.  [facility] [phone]   Fax:   [phone]

## 2020-08-05 ENCOUNTER — Ambulatory Visit: Payer: Self-pay | Admitting: Neurology

## 2020-08-12 ENCOUNTER — Ambulatory Visit (INDEPENDENT_AMBULATORY_CARE_PROVIDER_SITE_OTHER): Payer: Self-pay | Admitting: Neurology

## 2020-08-12 ENCOUNTER — Encounter: Payer: Self-pay | Admitting: Neurology

## 2020-08-12 VITALS — BP 146/82 | HR 75 | Ht 62.0 in | Wt 169.0 lb

## 2020-08-12 DIAGNOSIS — G40909 Epilepsy, unspecified, not intractable, without status epilepticus: Secondary | ICD-10-CM

## 2020-08-12 MED ORDER — LEVETIRACETAM 500 MG PO TABS: 1500.0000 mg | ORAL_TABLET | Freq: Two times a day (BID) | ORAL | 3 refills | Status: DC

## 2020-08-12 NOTE — Progress Notes (Signed)
PATIENT: Beth Blackwell DOB: 04/10/1976  REASON FOR VISIT: follow up HISTORY FROM: patient  HISTORY OF PRESENT ILLNESS: Today 08/12/20  08/12/20 SS: [name] is a 45 year old female with history of nocturnal seizures.  She is on gabapentin and Keppra.  Last seizure was in February 2021, she had reduced her dose of gabapentin to 600 mg twice a day.  After her last visit, she resumed her prescribed dose at 600 mg 3 times a day, along with Keppra 1500 mg twice a day.  She has had no recurrent seizure. She drives a car, works Tax inspector.  Request that her PCP fill the medication going forward, she currently does not have health insurance.  Presents today for evaluation accompanied by a Spanish interpreter.  Update 07/05/2019 SS:[name] is a 45 year old female with history of nocturnal seizures.  She has not been seen at this office since November 2018.  She is treated with Keppra and gabapentin.  Her primary doctor has been filling her medications.  Her last seizure occurred in 2011 of record.  She reports in March 2020, she had a seizure, occurred around 4 AM, has sensation that her 3-5th fingers are " moving around", lost consciousness, had full body shaking.  The episode was witnessed by her husband.  She bit her tongue.  Around that time, she had decreased her dose of gabapentin taking 600 mg twice a day, versus 3.  She remains on Keppra, and have been compliant.  She continues to take a lower dose of gabapentin 600 mg twice a day.  She had a seizure last week, as result of forgetting to take her evening dose of Keppra.  It had the same presentation, witnessed by her husband, resulting in her biting her tongue.  Her seizures have always occur nocturnally.  She currently does not have any health insurance.  According to the chart, previous EEG was abnormal in the left temporal area, questionable underlying pathology.  She has had MRI of the brain that was normal.  She presents today for evaluation  accompanied by her husband and a [place] interpreter.  HISTORY 04/14/2017 CM: HISTORY OF PRESENT ILLNESS:[name] is a 45 year old right-handed Hispanic female with a history of nocturnal seizure events. She has better  English at this visit and does not need  interpreter. The patient also has infrequent episodes of brief tremors affecting the right hand and arm. This has been going on for  years. The patient has had 2 generalized seizures at nighttime, but she has not had any events since 2011. She is being treated with Keppra and gabapentin, and she is tolerating the medications well. The patient does operate a motor vehicle without difficulty. She returns to this office for an evaluation. She reports no other new medical issues that have come up since last seen.  She also reports that for her follow-up visit next year she will not have insurance.  She was made aware of since her seizure disorder has been stable she may want to ask her primary care doctor because if he will manage this. She has an appointment in January with him.  She returns for reevaluation and refills  REVIEW OF SYSTEMS: Out of a complete 14 system review of symptoms, the patient complains only of the following symptoms, and all other reviewed systems are negative.  Seizures  ALLERGIES: Allergies  Allergen Reactions  . Acetaminophen Shortness Of Breath    HOME MEDICATIONS: Outpatient Medications Prior to Visit  Medication Sig Dispense  Refill  . Calcium Carbonate-Vitamin D 600-400 MG-UNIT tablet Take 1 tablet by mouth daily.    . cetirizine (ZYRTEC) 10 MG tablet Take 10 mg by mouth daily.    . cholecalciferol (VITAMIN D3) 25 MCG (1000 UT) tablet Take 1,000 Units by mouth daily.    Marland Kitchen gabapentin (NEURONTIN) 600 MG tablet Take 1 tablet (600 mg total) by mouth 3 (three) times daily. 270 tablet 3  . Vitamin D, Ergocalciferol, (DRISDOL) 1.25 MG (50000 UNIT) CAPS capsule Take 1 capsule (50,000 Units total) by mouth every 7  (seven) days. 12 capsule 0  . levETIRAcetam (KEPPRA) 500 MG tablet Take 3 tablets (1,500 mg total) by mouth 2 (two) times daily. Take 3 tablets twice daily (stay at this dose) 180 tablet 0   No facility-administered medications prior to visit.    PAST MEDICAL HISTORY: Past Medical History:  Diagnosis Date  . Depression   . High cholesterol   . Retained orthopedic hardware 02/2014   right wrist/forearm  . Seasonal allergies   . Seizures ([place])    no seizures  since ~ 2012  . Sickle cell trait ([place])   . Varicose vein of leg   . Vitamin D deficiency     PAST SURGICAL HISTORY: Past Surgical History:  Procedure Laterality Date  . ABDOMINAL HYSTERECTOMY  12/28/2005   NO oophorectomy  . APPENDECTOMY    . CESAREAN SECTION     x 2  . HARDWARE REMOVAL Right 03/09/2014   Procedure: REMOVAL PLATE AND SCREW RIGHT ULNAR;  Surgeon: Daryll Brod, MD;  Location: [facility];  Service: Orthopedics;  Laterality: Right;  . LAPAROSCOPIC LYSIS OF ADHESIONS  09/22/2006   abdominopelvic  . OVARIAN CYST REMOVAL Right 09/22/2006  . TUBAL LIGATION  02/06/2000  . WRIST ARTHROSCOPY WITH ULNA SHORTENING Right 06/25/2010   and TFCC debridement, shrinkage scapholunate ligament    FAMILY HISTORY: Family History  Adopted: Yes  Problem Relation Age of Onset  . Diabetes Father   . Hypertension Father   . Hypertension Sister   . Seizures Neg Hx   . Colon cancer Neg Hx   . Breast cancer Neg Hx     SOCIAL HISTORY: Social History   Socioeconomic History  . Marital status: Married    Spouse name: Not on file  . Number of children: 2  . Years of education: Not on file  . Highest education level: Not on file  Occupational History  . Occupation: cleaning   Tobacco Use  . Smoking status: Never Smoker  . Smokeless tobacco: Never Used  Vaping Use  . Vaping Use: Never used  Substance and Sexual Activity  . Alcohol use: No    Alcohol/week: 0.0 standard drinks  . Drug use: No  . Sexual  activity: Yes    Partners: Male    Birth control/protection: Surgical    Comment: first intercourse at 82, married 44.  Other Topics Concern  . Not on file  Social History Narrative   Original from [place]   Household-- pt, husband   2 children (son 65, daughter2001)   Social Determinants of Health   Financial Resource Strain: Not on file  Food Insecurity: Not on file  Transportation Needs: Not on file  Physical Activity: Not on file  Stress: Not on file  Social Connections: Not on file  Intimate Partner Violence: Not on file   PHYSICAL EXAM  Vitals:   08/12/20 1522  BP: (!) 146/82  Pulse: 75  Weight: 169 lb (76.7  kg)  Height: '5\' 2"'  (1.575 m)   Body mass index is 30.91 kg/m.  Generalized: Well developed, in no acute distress   Neurological examination  Mentation: Alert oriented to time, place, history taking. Follows all commands speech and language fluent, speaks some English, visit was assisted by interpreter Cranial nerve II-XII: Pupils were equal round reactive to light. Extraocular movements were full, visual field were full on confrontational test. Facial sensation and strength were normal. Head turning and shoulder shrug  were normal and symmetric. Motor: The motor testing reveals 5 over 5 strength of all 4 extremities. Good symmetric motor tone is noted throughout.  Sensory: Sensory testing is intact to soft touch on all 4 extremities. No evidence of extinction is noted.  Coordination: Cerebellar testing reveals good finger-nose-finger and heel-to-shin bilaterally.  Gait and station: Gait is normal. Tandem gait is normal. Reflexes: Deep tendon reflexes are symmetric and normal bilaterally.   DIAGNOSTIC DATA (LABS, IMAGING, TESTING) - I reviewed patient records, labs, notes, testing and imaging myself where available.  Lab Results  Component Value Date   WBC 5.3 01/08/2020   HGB 12.5 01/08/2020   HCT 36.9 01/08/2020   MCV 89.9 01/08/2020   PLT  215.0 01/08/2020      Component Value Date/Time   NA 139 01/08/2020 1432   K 3.6 01/08/2020 1432   CL 105 01/08/2020 1432   CO2 26 01/08/2020 1432   GLUCOSE 89 01/08/2020 1432   BUN 12 01/08/2020 1432   CREATININE 0.56 01/08/2020 1432   CALCIUM 9.8 01/08/2020 1432   PROT 6.9 01/08/2020 1432   ALBUMIN 4.6 01/08/2020 1432   AST 17 01/08/2020 1432   ALT 26 01/08/2020 1432   ALKPHOS 50 01/08/2020 1432   BILITOT 0.5 01/08/2020 1432   GFRNONAA >60 09/21/2009 0755   GFRAA  09/21/2009 0755    >60        The eGFR has been calculated using the MDRD equation. This calculation has not been validated in all clinical situations. eGFR's persistently <60 mL/min signify possible Chronic Kidney Disease.   Lab Results  Component Value Date   CHOL 235 (H) 01/08/2020   HDL 43.60 01/08/2020   LDLCALC 159 (H) 01/08/2020   LDLDIRECT 141.0 12/28/2018   TRIG 162.0 (H) 01/08/2020   CHOLHDL 5 01/08/2020   Lab Results  Component Value Date   HGBA1C 5.5 07/06/2012   No results found for: VITAMINB12 Lab Results  Component Value Date   TSH 2.54 12/28/2018   ASSESSMENT AND PLAN 45 y.o. year old female  has a past medical history of Depression, High cholesterol, Retained orthopedic hardware (02/2014), Seasonal allergies, Seizures ([place]), Sickle cell trait ([place]), Varicose vein of leg, and Vitamin D deficiency. here with:  1.  Seizures  -Last seizure in February 2021, taking self-reduced dose of gabapentin 600 mg 2 times daily  -No recurrent seizures since  -Continue gabapentin 600 mg 3 times daily -Continue Keppra 1500 mg twice daily -Request for PCP to fill medications going forward -Follow-up in our office on an as-needed basis, call for seizure activity  I spent 20 minutes of face-to-face and non-face-to-face time with patient.  This included previsit chart review, lab review, study review, order entry, electronic health record documentation, patient education.  Butler Denmark, AGNP-C, DNP  08/12/2020, 3:44 PM [facility] [address], [place], [place] [address] [phone]

## 2020-08-12 NOTE — Patient Instructions (Signed)
Continue current medications Can see PCP in future Follow up here as needed Call for seizures

## 2020-08-13 NOTE — Progress Notes (Signed)
I have read the note, and I agree with the clinical assessment and plan.  Ruchel Brandenburger K Laquasha Groome   

## 2021-01-08 ENCOUNTER — Ambulatory Visit (INDEPENDENT_AMBULATORY_CARE_PROVIDER_SITE_OTHER): Payer: Self-pay | Admitting: Internal Medicine

## 2021-01-08 ENCOUNTER — Other Ambulatory Visit: Payer: Self-pay

## 2021-01-08 ENCOUNTER — Encounter: Payer: Self-pay | Admitting: Internal Medicine

## 2021-01-08 VITALS — BP 122/84 | HR 66 | Temp 98.3°F | Resp 16 | Ht 62.0 in | Wt 165.0 lb

## 2021-01-08 DIAGNOSIS — Z0000 Encounter for general adult medical examination without abnormal findings: Secondary | ICD-10-CM

## 2021-01-08 DIAGNOSIS — D649 Anemia, unspecified: Secondary | ICD-10-CM

## 2021-01-08 DIAGNOSIS — Z1231 Encounter for screening mammogram for malignant neoplasm of breast: Secondary | ICD-10-CM

## 2021-01-08 DIAGNOSIS — G40909 Epilepsy, unspecified, not intractable, without status epilepticus: Secondary | ICD-10-CM

## 2021-01-08 DIAGNOSIS — E785 Hyperlipidemia, unspecified: Secondary | ICD-10-CM

## 2021-01-08 DIAGNOSIS — E559 Vitamin D deficiency, unspecified: Secondary | ICD-10-CM

## 2021-01-08 LAB — CBC WITH DIFFERENTIAL/PLATELET
Basophils Absolute: 0 10*3/uL (ref 0.0–0.1)
Basophils Relative: 0.6 % (ref 0.0–3.0)
Eosinophils Absolute: 0.1 10*3/uL (ref 0.0–0.7)
Eosinophils Relative: 1.2 % (ref 0.0–5.0)
HCT: 38.6 % (ref 36.0–46.0)
Hemoglobin: 13 g/dL (ref 12.0–15.0)
Lymphocytes Relative: 33.5 % (ref 12.0–46.0)
Lymphs Abs: 1.5 10*3/uL (ref 0.7–4.0)
MCHC: 33.8 g/dL (ref 30.0–36.0)
MCV: 89.3 fl (ref 78.0–100.0)
Monocytes Absolute: 0.2 10*3/uL (ref 0.1–1.0)
Monocytes Relative: 3.5 % (ref 3.0–12.0)
Neutro Abs: 2.8 10*3/uL (ref 1.4–7.7)
Neutrophils Relative %: 61.2 % (ref 43.0–77.0)
Platelets: 223 10*3/uL (ref 150.0–400.0)
RBC: 4.32 Mil/uL (ref 3.87–5.11)
RDW: 12.3 % (ref 11.5–15.5)
WBC: 4.6 10*3/uL (ref 4.0–10.5)

## 2021-01-08 LAB — LIPID PANEL
Cholesterol: 236 mg/dL — ABNORMAL HIGH (ref 0–200)
HDL: 38.7 mg/dL — ABNORMAL LOW (ref >39.00)
NonHDL: 197.4
Total CHOL/HDL Ratio: 6
Triglycerides: 253 mg/dL — ABNORMAL HIGH (ref 0.0–149.0)
VLDL: 50.6 mg/dL — ABNORMAL HIGH (ref 0.0–40.0)

## 2021-01-08 LAB — COMPREHENSIVE METABOLIC PANEL
ALT: 34 U/L (ref 0–35)
AST: 21 U/L (ref 0–37)
Albumin: 4.4 g/dL (ref 3.5–5.2)
Alkaline Phosphatase: 64 U/L (ref 39–117)
BUN: 9 mg/dL (ref 6–23)
CO2: 28 mEq/L (ref 19–32)
Calcium: 9.3 mg/dL (ref 8.4–10.5)
Chloride: 106 mEq/L (ref 96–112)
Creatinine, Ser: 0.57 mg/dL (ref 0.40–1.20)
GFR: 109.84 mL/min (ref >60.00)
Glucose, Bld: 92 mg/dL (ref 70–99)
Potassium: 3.8 mEq/L (ref 3.5–5.1)
Sodium: 140 mEq/L (ref 135–145)
Total Bilirubin: 0.5 mg/dL (ref 0.2–1.2)
Total Protein: 6.8 g/dL (ref 6.0–8.3)

## 2021-01-08 LAB — TSH: TSH: 2.24 u[IU]/mL (ref 0.35–5.50)

## 2021-01-08 LAB — LDL CHOLESTEROL, DIRECT: Direct LDL: 150 mg/dL

## 2021-01-08 LAB — FERRITIN: Ferritin: 161.1 ng/mL (ref 10.0–291.0)

## 2021-01-08 LAB — IRON: Iron: 106 ug/dL (ref 42–145)

## 2021-01-08 LAB — VITAMIN D 25 HYDROXY (VIT D DEFICIENCY, FRACTURES): VITD: 49.39 ng/mL (ref 30.00–100.00)

## 2021-01-08 NOTE — Progress Notes (Signed)
Subjective:    Patient ID: Beth Blackwell, female    DOB: 23-Oct-1975, 45 y.o.   MRN: [id number]  DOS:  01/08/2021 Type of visit - description: CPX  Here for CPX, we also talk about seizures, vitamin D deficiency. Reports 1 month history of some numbness on the L great toe.  No injury, no swelling, no redness Admits to very mild back pain on and off without radiation.   Review of Systems  Other than above, a 14 point review of systems is negative       Past Medical History:  Diagnosis Date   Depression    High cholesterol    Retained orthopedic hardware 02/2014   right wrist/forearm   Seasonal allergies    Seizures (HCC)    no seizures  since ~ 2012   Sickle cell trait (HCC)    Varicose vein of leg    Vitamin D deficiency     Past Surgical History:  Procedure Laterality Date   ABDOMINAL HYSTERECTOMY  12/28/2005   NO oophorectomy   APPENDECTOMY     CESAREAN SECTION     x 2   HARDWARE REMOVAL Right 03/09/2014   Procedure: REMOVAL PLATE AND SCREW RIGHT ULNAR;  Surgeon: Cindee Salt, MD;  Location: [facility];  Service: Orthopedics;  Laterality: Right;   LAPAROSCOPIC LYSIS OF ADHESIONS  09/22/2006   abdominopelvic   OVARIAN CYST REMOVAL Right 09/22/2006   TUBAL LIGATION  02/06/2000   WRIST ARTHROSCOPY WITH ULNA SHORTENING Right 06/25/2010   and TFCC debridement, shrinkage scapholunate ligament   Social History   Socioeconomic History   Marital status: Married    Spouse name: Not on file   Number of children: 2   Years of education: Not on file   Highest education level: Not on file  Occupational History   Occupation: cleaning   Tobacco Use   Smoking status: Never   Smokeless tobacco: Never  Vaping Use   Vaping Use: Never used  Substance and Sexual Activity   Alcohol use: No    Alcohol/week: 0.0 standard drinks   Drug use: No   Sexual activity: Yes    Partners: Male    Birth control/protection: Surgical    Comment: first intercourse at 6,  married 25.  Other Topics Concern   Not on file  Social History Narrative   Original from [place], [place]   Household-- pt, husband   2 children (son 46, daughter 75)   Social Determinants of Health   Financial Resource Strain: Not on file  Food Insecurity: Not on file  Transportation Needs: Not on file  Physical Activity: Not on file  Stress: Not on file  Social Connections: Not on file  Intimate Partner Violence: Not on file    Allergies as of 01/08/2021       Reactions   Acetaminophen Shortness Of Breath        Medication List        Accurate as of January 08, 2021 11:54 AM. If you have any questions, ask your nurse or doctor.          STOP taking these medications    Vitamin D (Ergocalciferol) 1.25 MG (50000 UNIT) Caps capsule Commonly known as: DRISDOL Stopped by: Willow Ora, MD       TAKE these medications    Calcium Carbonate-Vitamin D 600-400 MG-UNIT tablet Take 1 tablet by mouth daily.   cetirizine 10 MG tablet Commonly known as: ZYRTEC Take 10 mg by mouth daily.  cholecalciferol 25 MCG (1000 UNIT) tablet Commonly known as: VITAMIN D3 Take 1,000 Units by mouth daily.   gabapentin 600 MG tablet Commonly known as: NEURONTIN Take 1 tablet (600 mg total) by mouth 3 (three) times daily.   levETIRAcetam 500 MG tablet Commonly known as: KEPPRA Take 3 tablets (1,500 mg total) by mouth 2 (two) times daily. Take 3 tablets twice daily (stay at this dose)           Objective:   Physical Exam BP 122/84 (BP Location: Left Arm, Patient Position: Sitting, Cuff Size: Small)   Pulse 66   Temp 98.3 F (36.8 C) (Oral)   Resp 16   Ht 5\' 2"  (1.575 m)   Wt 165 lb (74.8 kg)   SpO2 97%   BMI 30.18 kg/m  General: Well developed, NAD, BMI noted Neck: No  thyromegaly  HEENT:  Normocephalic . Face symmetric, atraumatic Lungs:  CTA B Normal respiratory effort, no intercostal retractions, no accessory muscle use. Heart: RRR,  no murmur.   Abdomen:  Not distended, soft, non-tender. No rebound or rigidity.   Lower extremities: no pretibial edema bilaterally  Skin: Exposed areas without rash. Not pale. Not jaundice Neurologic:  alert & oriented X3.  Speech normal, gait appropriate for age and unassisted Pinprick examination feet: Very subtle decreased sensitivity on the L great toe.  Motor seems to be minimally decreased there as well. DTRs symmetric Psych: Cognition and judgment appear intact.  Cooperative with normal attention span and concentration.  Behavior appropriate. No anxious or depressed appearing.     Assessment      Assessment H/o Depression Hyperlipidemia H/o Asthma Seasonal allergies Vitamin D deficiency Sickle cell trait History of seizures LFTs elevated:  --Hep B and C:  (-) 2017 --01-2017: wnl  AMA, alpha 1 antitrypsin, SPEP, iron studies  --Abdominal 06-25-1987  01-2017: fatty liver, one gallbladder polyp( no f/u  per guidelines)  PLAN: Here for CPX Seizures: Had a seizure 06-2019, saw neurology, Keppra levels were low, dose increased to 3 tablets B.I.D., she is also on gabapentin.  No seizures since then.  Neurology requested me to refill Keppra, I will.  However, if she has any neurological or seizure problem needs to reach out to neurology. L great toe numbness: Findings are very subtle, if further investigation is needed she needs to see neurology.  Definitely proceed if symptoms increase. Vitamin D deficiency: S/p ergo, now on OTCs, checking levels Anemia?  Patient liked to donate blood few weeks ago, was told she did not qualify, anemia?  She had a hysterectomy, no GI symptoms.  Checking iron and ferritin. RTC 1 year.      This visit occurred during the SARS-CoV-2 public health emergency.  Safety protocols were in place, including screening questions prior to the visit, additional usage of staff PPE, and extensive cleaning of exam room while observing appropriate contact time as indicated for  disinfecting solutions.

## 2021-01-08 NOTE — Patient Instructions (Addendum)
Still recommend to proceed with covid vaccine series and a flu shot.  You are due for your yearly mammogram. An order has been placed, you may stop downstairs ([address]) to schedule that.     GO TO THE LAB : Get the blood work     GO TO THE FRONT DESK, PLEASE SCHEDULE YOUR APPOINTMENTS Come back for a physical exam in 1 year

## 2021-01-08 NOTE — Assessment & Plan Note (Signed)
-  Td 2015 - PNM 23: 11-2015 -Covid unvaccinated, again recommend to proceed with vaccines pro >> cons - flu shot  rec q. year -Female care  gyn is  Dr Seymour Bars; s/p hysterectomy, no oophorectomy, no further paps.   MMG 12/2019, MMG ordered -CCS: 3 options d/w pt , rx ifob --Labs: CMP, FLP, CBC, Keppra levels, vitamin D, TSH.  Also iron and ferritin. -Diet and exercise discussed

## 2021-01-08 NOTE — Assessment & Plan Note (Addendum)
Here for CPX Seizures: Had a seizure 06-2019, saw neurology, Keppra levels were low, dose increased to 3 tablets B.I.D., she is also on gabapentin.  No seizures since then.  Neurology requested me to refill Keppra, I will.  However, if she has any neurological or seizure problem needs to reach out to neurology. L great toe numbness: Findings are very subtle, if further investigation is needed she needs to see neurology.  Definitely proceed if symptoms increase. Vitamin D deficiency: S/p ergo, now on OTCs, checking levels Anemia?  Patient liked to donate blood few weeks ago, was told she did not qualify, anemia?  She had a hysterectomy, no GI symptoms.  Checking iron and ferritin. RTC 1 year.

## 2021-01-09 ENCOUNTER — Other Ambulatory Visit: Payer: Self-pay

## 2021-01-09 DIAGNOSIS — Z1231 Encounter for screening mammogram for malignant neoplasm of breast: Secondary | ICD-10-CM

## 2021-01-10 LAB — LEVETIRACETAM LEVEL: Levetiracetam Lvl: 7.5 ug/mL — ABNORMAL LOW (ref 10.0–40.0)

## 2021-01-20 ENCOUNTER — Other Ambulatory Visit: Payer: Self-pay | Admitting: Neurology

## 2021-01-21 ENCOUNTER — Other Ambulatory Visit: Payer: Self-pay

## 2021-01-21 MED ORDER — LEVETIRACETAM 500 MG PO TABS: 1500.0000 mg | ORAL_TABLET | Freq: Two times a day (BID) | ORAL | 1 refills | Status: DC

## 2021-01-28 ENCOUNTER — Other Ambulatory Visit: Payer: Self-pay | Admitting: Obstetrics and Gynecology

## 2021-01-28 DIAGNOSIS — Z1231 Encounter for screening mammogram for malignant neoplasm of breast: Secondary | ICD-10-CM

## 2021-02-04 ENCOUNTER — Ambulatory Visit (HOSPITAL_BASED_OUTPATIENT_CLINIC_OR_DEPARTMENT_OTHER): Payer: Self-pay

## 2021-02-10 ENCOUNTER — Encounter: Payer: Self-pay | Admitting: Internal Medicine

## 2021-02-13 ENCOUNTER — Other Ambulatory Visit: Payer: Self-pay

## 2021-02-13 ENCOUNTER — Ambulatory Visit: Payer: Self-pay

## 2021-02-13 ENCOUNTER — Ambulatory Visit: Payer: Self-pay | Admitting: *Deleted

## 2021-02-13 ENCOUNTER — Encounter (INDEPENDENT_AMBULATORY_CARE_PROVIDER_SITE_OTHER): Payer: Self-pay

## 2021-02-13 VITALS — BP 122/88 | Wt 164.2 lb

## 2021-02-13 DIAGNOSIS — Z1239 Encounter for other screening for malignant neoplasm of breast: Secondary | ICD-10-CM

## 2021-02-13 DIAGNOSIS — N644 Mastodynia: Secondary | ICD-10-CM

## 2021-02-13 NOTE — Progress Notes (Signed)
[name] is a 45 y.o. female who presents to [facility] clinic today with complaint of left outer breast pain x one month that comes and goes. Patient rates the pain at a 7 out of 10.    Pap Smear: Pap smear not completed today. Last Pap smear was 04/10/2011 at Dr. Fontaine No office and normal. Per patient has no history of an abnormal Pap smear. Patient has a history of a hysterectomy 12/28/2005 due to AUB. Patient no longer needs Pap smears due to her history of a hysterectomy for benign reasons per BCCCP and ASCCP guidelines. Last Pap smear and hysterectomy results are in Epic.   Physical exam: Breasts Breasts symmetrical. No skin abnormalities bilateral breasts. No nipple retraction bilateral breasts. No nipple discharge bilateral breasts. No lymphadenopathy. No lumps palpated bilateral breasts. Complaints of left upper outer and above nipple breast pain on exam.   MM DIGITAL SCREENING BILATERAL  Result Date: 07/07/2016 CLINICAL DATA:  Screening. EXAM: DIGITAL SCREENING BILATERAL MAMMOGRAM WITH CAD COMPARISON:  Previous exam(s). ACR Breast Density Category b: There are scattered areas of fibroglandular density. FINDINGS: There are no findings suspicious for malignancy. Images were processed with CAD. IMPRESSION: No mammographic evidence of malignancy. A result letter of this screening mammogram will be mailed directly to the patient. RECOMMENDATION: Screening mammogram in one year. (Code:SM-B-01Y) BI-RADS CATEGORY  1: Negative. Electronically Signed   By: Bary Richard M.D.   On: 07/07/2016 12:08   MM DIAG BREAST TOMO UNI LEFT  Result Date: 11/26/2017 CLINICAL DATA:  Possible mass in the outer left breast in the craniocaudal projection of the recent screening mammogram. EXAM: DIGITAL DIAGNOSTIC LEFT MAMMOGRAM WITH CAD AND TOMO ULTRASOUND LEFT BREAST COMPARISON:  Previous exam(s). ACR Breast Density Category b: There are scattered areas of fibroglandular density. FINDINGS: 3D tomographic and 2D  generated true lateral and spot compression craniocaudal views of the left breast were obtained. These confirm an oval, circumscribed mass in the upper-outer quadrant in the middle 3rd. Mammographic images were processed with CAD. On physical exam, no mass is palpable in the outer left breast. Targeted ultrasound is performed, showing multiple cysts in the outer left breast. These include a 6 mm cyst in the 2:30 o'clock position, 4 cm from the nipple, a 9 mm cyst in the 4 o'clock position, 4 cm from the nipple and a 1.6 x 1.3 x 0.7 cm cluster of cysts in the 2 o'clock position, 1 cm from the nipple. No solid masses were seen. IMPRESSION: Multiple benign left breast cysts.  No evidence of malignancy. RECOMMENDATION: Bilateral screening mammogram in 1 year. I have discussed the findings and recommendations with the patient. Results were also provided in writing at the conclusion of the visit. If applicable, a reminder letter will be sent to the patient regarding the next appointment. BI-RADS CATEGORY  2: Benign. Electronically Signed   By: Beckie Salts M.D.   On: 11/26/2017 15:21   MM 3D SCREEN BREAST BILATERAL  Result Date: 01/15/2020 CLINICAL DATA:  Screening. EXAM: DIGITAL SCREENING BILATERAL MAMMOGRAM WITH TOMO AND CAD COMPARISON:  Previous exam(s). ACR Breast Density Category b: There are scattered areas of fibroglandular density. FINDINGS: There are no findings suspicious for malignancy. Images were processed with CAD. IMPRESSION: No mammographic evidence of malignancy. A result letter of this screening mammogram will be mailed directly to the patient. RECOMMENDATION: Screening mammogram in one year. (Code:SM-B-01Y) BI-RADS CATEGORY  1: Negative. Electronically Signed   By: Baird Lyons M.D.   On: 01/15/2020 10:27  MS DIGITAL SCREENING TOMO BILATERAL  Result Date: 11/23/2017 CLINICAL DATA:  Screening. EXAM: DIGITAL SCREENING BILATERAL MAMMOGRAM WITH TOMO AND CAD COMPARISON:  Previous exam(s). ACR Breast  Density Category b: There are scattered areas of fibroglandular density. FINDINGS: In the left breast, a possible asymmetry warrants further evaluation. In the right breast, no findings suspicious for malignancy. Images were processed with CAD. IMPRESSION: Further evaluation is suggested for possible asymmetry in the left breast. RECOMMENDATION: Diagnostic mammogram and possibly ultrasound of the left breast. (Code:[id number]-37M) The patient will be contacted regarding the findings, and additional imaging will be scheduled. BI-RADS CATEGORY  0: Incomplete. Need additional imaging evaluation and/or prior mammograms for comparison. Electronically Signed   By: Beckie Salts M.D.   On: 11/23/2017 15:01       Pelvic/Bimanual Pap is not indicated today per BCCCP guidelines.   Smoking History: Patient has never smoked.   Patient Navigation: Patient education provided. Access to services provided for patient through [facility] program. Spanish interpreter [name] from [facility] provided.   Colorectal Cancer Screening: Per patient has never had colonoscopy completed. Patient has a FIT Test at that was given by her PCP 01/08/2021 that she has not completed. No complaints today.    Breast and Cervical Cancer Risk Assessment: Patient does not have family history of breast cancer, known genetic mutations, or radiation treatment to the chest before age 72. Patient does not have history of cervical dysplasia, immunocompromised, or DES exposure in-utero.  Risk Assessment     Risk Scores       02/13/2021 11/23/2017   Last edited by: [name], CMA [name], LPN   5-year risk: 0.4 % 0.3 %   Lifetime risk: 4.5 % 4.6 %            A: BCCCP exam without pap smear Complaint of left outer breast pain.  P: Referred patient to the [facility] for a diagnostic mammogram. Appointment scheduled Thursday, February 27, 2021 at 1310.  [name], RN 02/13/2021 1:08 PM

## 2021-02-13 NOTE — Patient Instructions (Signed)
Explained breast self awareness with Ulla Gallo. Patient did not need a Pap smear today due to patient has a history of a hysterectomy for benign reasons. Let patient know that she doesn't need any further Pap smears due to her history of a hysterectomy for benign reasons. Referred patient to the [facility] for a diagnostic mammogram. Appointment scheduled Thursday, February 27, 2021 at 1310. Patient aware of appointment and will be there. Ulla Gallo verbalized understanding.  Lorijean Husser, Kathaleen Maser, RN 1:08 PM

## 2021-02-27 ENCOUNTER — Ambulatory Visit: Payer: Self-pay

## 2021-02-27 ENCOUNTER — Other Ambulatory Visit: Payer: Self-pay

## 2021-02-27 ENCOUNTER — Ambulatory Visit
Admission: RE | Admit: 2021-02-27 | Discharge: 2021-02-27 | Disposition: A | Discharge status: Home / Self Care | Payer: No Typology Code available for payment source | Source: Ambulatory Visit | Attending: Obstetrics and Gynecology | Admitting: Obstetrics and Gynecology

## 2021-02-27 DIAGNOSIS — N644 Mastodynia: Secondary | ICD-10-CM

## 2021-04-21 ENCOUNTER — Other Ambulatory Visit: Payer: Self-pay | Admitting: Internal Medicine

## 2021-09-01 ENCOUNTER — Ambulatory Visit (INDEPENDENT_AMBULATORY_CARE_PROVIDER_SITE_OTHER): Payer: Self-pay | Admitting: Obstetrics & Gynecology

## 2021-09-01 ENCOUNTER — Encounter: Payer: Self-pay | Admitting: Obstetrics & Gynecology

## 2021-09-01 VITALS — BP 144/94 | HR 74 | Ht 62.75 in | Wt 173.0 lb

## 2021-09-01 DIAGNOSIS — Z6830 Body mass index (BMI) 30.0-30.9, adult: Secondary | ICD-10-CM

## 2021-09-01 DIAGNOSIS — Z90710 Acquired absence of both cervix and uterus: Secondary | ICD-10-CM

## 2021-09-01 DIAGNOSIS — Z01419 Encounter for gynecological examination (general) (routine) without abnormal findings: Secondary | ICD-10-CM

## 2021-09-01 DIAGNOSIS — E6609 Other obesity due to excess calories: Secondary | ICD-10-CM

## 2021-09-01 NOTE — Progress Notes (Signed)
? ? ?Snow Peoples Jun 15, 1975 [id number] ? ? ?History:    46 y.o. G2P2L2 Married.  Son in 6 yo in [place].  Daughter 82 yo at [employer] in [place], children's. ?  ?RP:  Established patient presenting for annual gyn exam  ?  ?HPI: S/P TAH 2007 for menorrhagia.  No pelvic pain.  No pain with IC.  No h/o abnormal Pap, last Pap Neg in 2012.  Urine/BMs wnl.  Breasts normal.  Mammo 02/2021 Neg.  BMI 30.89.  Needs to increase fitness activities and decrease Calories/Carbs.  Health labs with Dr Drue Novel. ? ?Past medical history,surgical history, family history and social history were all reviewed and documented in the EPIC chart. ? ?Gynecologic History ?No LMP recorded. Patient has had a hysterectomy. ? ?Obstetric History ?OB History  ?Gravida Para Term Preterm AB Living  ?2 2       2   ?SAB IAB Ectopic Multiple Live Births  ?           ?  ?# Outcome Date GA Lbr Len/2nd Weight Sex Delivery Anes PTL Lv  ?2 Para      CS-LTranv     ?1 Para      CS-LTranv     ? ? ? ?ROS: A ROS was performed and pertinent positives and negatives are included in the history. ?GENERAL: No fevers or chills. HEENT: No change in vision, no earache, sore throat or sinus congestion. NECK: No pain or stiffness. CARDIOVASCULAR: No chest pain or pressure. No palpitations. PULMONARY: No shortness of breath, cough or wheeze. GASTROINTESTINAL: No abdominal pain, nausea, vomiting or diarrhea, melena or bright red blood per rectum. GENITOURINARY: No urinary frequency, urgency, hesitancy or dysuria. MUSCULOSKELETAL: No joint or muscle pain, no back pain, no recent trauma. DERMATOLOGIC: No rash, no itching, no lesions. ENDOCRINE: No polyuria, polydipsia, no heat or cold intolerance. No recent change in weight. HEMATOLOGICAL: No anemia or easy bruising or bleeding. NEUROLOGIC: No headache, seizures, numbness, tingling or weakness. PSYCHIATRIC: No depression, no loss of interest in normal activity or change in sleep pattern.  ?  ? ?Exam: ? ? ?BP (!) 144/94   Pulse 74    Ht 5' 2.75" (1.594 m)   Wt 173 lb (78.5 kg)   SpO2 98%   BMI 30.89 kg/m?  ? ?Body mass index is 30.89 kg/m?. ? ?General appearance : Well developed well nourished female. No acute distress ?HEENT: Eyes: no retinal hemorrhage or exudates,  Neck supple, trachea midline, no carotid bruits, no thyroidmegaly ?Lungs: Clear to auscultation, no rhonchi or wheezes, or rib retractions  ?Heart: Regular rate and rhythm, no murmurs or gallops ?Breast:Examined in sitting and supine position were symmetrical in appearance, no palpable masses or tenderness,  no skin retraction, no nipple inversion, no nipple discharge, no skin discoloration, no axillary or supraclavicular lymphadenopathy ?Abdomen: no palpable masses or tenderness, no rebound or guarding ?Extremities: no edema or skin discoloration or tenderness ? ?Pelvic: Vulva: Normal ?            Vagina: No gross lesions or discharge ? Cervix/Uterus Absent ? Adnexa  Without masses or tenderness ? Anus: Normal ? ? ?Assessment/Plan:  46 y.o. female for annual exam  ? ?1. Well female exam with routine gynecological exam ?S/P TAH 2007 for menorrhagia.  No pelvic pain.  No pain with IC.  No h/o abnormal Pap, last Pap Neg in 2012.  Urine/BMs wnl.  Breasts normal.  Mammo 02/2021 Neg.  BMI 30.89.  Needs to increase fitness activities and  decrease Calories/Carbs.  Health labs with Dr Drue Novel. ? ?2. S/P TAH (total abdominal hysterectomy) ? ?3. Class 1 obesity due to excess calories without serious comorbidity with body mass index (BMI) of 30.0 to 30.9 in adult ?Decrease portions to have a lower calorie/carb diet.  Increase fitness activities. ? ?Other orders ?- Ascorbic Acid (VITAMIN C PO); Take by mouth.  ? ?Genia Del MD, 8:46 AM 09/01/2021 ? ?  ?

## 2021-12-01 ENCOUNTER — Other Ambulatory Visit: Payer: Self-pay | Admitting: Internal Medicine

## 2022-01-09 ENCOUNTER — Encounter: Payer: Self-pay | Admitting: Internal Medicine

## 2022-01-27 ENCOUNTER — Ambulatory Visit (INDEPENDENT_AMBULATORY_CARE_PROVIDER_SITE_OTHER): Payer: Self-pay | Admitting: Internal Medicine

## 2022-01-27 ENCOUNTER — Encounter: Payer: Self-pay | Admitting: Internal Medicine

## 2022-01-27 VITALS — BP 128/82 | HR 64 | Temp 98.6°F | Resp 16 | Ht 62.75 in | Wt 176.0 lb

## 2022-01-27 DIAGNOSIS — E559 Vitamin D deficiency, unspecified: Secondary | ICD-10-CM

## 2022-01-27 DIAGNOSIS — E785 Hyperlipidemia, unspecified: Secondary | ICD-10-CM

## 2022-01-27 DIAGNOSIS — Z0000 Encounter for general adult medical examination without abnormal findings: Secondary | ICD-10-CM

## 2022-01-27 DIAGNOSIS — G40909 Epilepsy, unspecified, not intractable, without status epilepticus: Secondary | ICD-10-CM

## 2022-01-27 LAB — CBC WITH DIFFERENTIAL/PLATELET
Basophils Absolute: 0 10*3/uL (ref 0.0–0.1)
Basophils Relative: 0.5 % (ref 0.0–3.0)
Eosinophils Absolute: 0.1 10*3/uL (ref 0.0–0.7)
Eosinophils Relative: 1.1 % (ref 0.0–5.0)
HCT: 38.3 % (ref 36.0–46.0)
Hemoglobin: 13 g/dL (ref 12.0–15.0)
Lymphocytes Relative: 32.8 % (ref 12.0–46.0)
Lymphs Abs: 1.7 10*3/uL (ref 0.7–4.0)
MCHC: 33.9 g/dL (ref 30.0–36.0)
MCV: 90.5 fl (ref 78.0–100.0)
Monocytes Absolute: 0.2 10*3/uL (ref 0.1–1.0)
Monocytes Relative: 4 % (ref 3.0–12.0)
Neutro Abs: 3.2 10*3/uL (ref 1.4–7.7)
Neutrophils Relative %: 61.6 % (ref 43.0–77.0)
Platelets: 232 10*3/uL (ref 150.0–400.0)
RBC: 4.24 Mil/uL (ref 3.87–5.11)
RDW: 13.2 % (ref 11.5–15.5)
WBC: 5.1 10*3/uL (ref 4.0–10.5)

## 2022-01-27 LAB — COMPREHENSIVE METABOLIC PANEL
ALT: 66 U/L — ABNORMAL HIGH (ref 0–35)
AST: 32 U/L (ref 0–37)
Albumin: 4.3 g/dL (ref 3.5–5.2)
Alkaline Phosphatase: 63 U/L (ref 39–117)
BUN: 13 mg/dL (ref 6–23)
CO2: 26 mEq/L (ref 19–32)
Calcium: 9.4 mg/dL (ref 8.4–10.5)
Chloride: 106 mEq/L (ref 96–112)
Creatinine, Ser: 0.52 mg/dL (ref 0.40–1.20)
GFR: 111.47 mL/min (ref >60.00)
Glucose, Bld: 100 mg/dL — ABNORMAL HIGH (ref 70–99)
Potassium: 4 mEq/L (ref 3.5–5.1)
Sodium: 140 mEq/L (ref 135–145)
Total Bilirubin: 0.4 mg/dL (ref 0.2–1.2)
Total Protein: 7 g/dL (ref 6.0–8.3)

## 2022-01-27 LAB — LIPID PANEL
Cholesterol: 252 mg/dL — ABNORMAL HIGH (ref 0–200)
HDL: 39.2 mg/dL (ref >39.00)
NonHDL: 212.55
Total CHOL/HDL Ratio: 6
Triglycerides: 377 mg/dL — ABNORMAL HIGH (ref 0.0–149.0)
VLDL: 75.4 mg/dL — ABNORMAL HIGH (ref 0.0–40.0)

## 2022-01-27 LAB — VITAMIN D 25 HYDROXY (VIT D DEFICIENCY, FRACTURES): VITD: 50.37 ng/mL (ref 30.00–100.00)

## 2022-01-27 LAB — LDL CHOLESTEROL, DIRECT: Direct LDL: 153 mg/dL

## 2022-01-27 NOTE — Progress Notes (Unsigned)
Subjective:    Patient ID: Beth Blackwell, female    DOB: 1975/12/13, 45 y.o.   MRN: [id number]  DOS:  01/27/2022 Type of visit - description: CPX Since the last office visit a year ago she is doing okay has no major concerns.  Wt Readings from Last 3 Encounters:  01/27/22 176 lb (79.8 kg)  09/01/21 173 lb (78.5 kg)  02/13/21 164 lb 3.2 oz (74.5 kg)     Review of Systems See above   Past Medical History:  Diagnosis Date   Depression    High cholesterol    Retained orthopedic hardware 02/2014   right wrist/forearm   Seasonal allergies    Seizures (HCC)    no seizures  since ~ 2012   Sickle cell trait (HCC)    Varicose vein of leg    Vitamin D deficiency     Past Surgical History:  Procedure Laterality Date   ABDOMINAL HYSTERECTOMY  12/28/2005   NO oophorectomy   APPENDECTOMY     CESAREAN SECTION     x 2   HARDWARE REMOVAL Right 03/09/2014   Procedure: REMOVAL PLATE AND SCREW RIGHT ULNAR;  Surgeon: Cindee Salt, MD;  Location: [facility];  Service: Orthopedics;  Laterality: Right;   LAPAROSCOPIC LYSIS OF ADHESIONS  09/22/2006   abdominopelvic   OVARIAN CYST REMOVAL Right 09/22/2006   TUBAL LIGATION  02/06/2000   WRIST ARTHROSCOPY WITH ULNA SHORTENING Right 06/25/2010   and TFCC debridement, shrinkage scapholunate ligament    Current Outpatient Medications  Medication Instructions   Ascorbic Acid (VITAMIN C PO) Oral   Calcium Carbonate-Vitamin D 600-400 MG-UNIT tablet 1 tablet, Oral, Daily,     cetirizine (ZYRTEC) 10 mg, Daily   cholecalciferol (VITAMIN D3) 1,000 Units, Oral, Daily   gabapentin (NEURONTIN) 600 MG tablet TAKE 1 TABLET BY MOUTH THREE TIMES DAILY   levETIRAcetam (KEPPRA) 1,500 mg, Oral, 2 times daily       Objective:   Physical Exam BP 128/82   Pulse 64   Temp 98.6 F (37 C) (Oral)   Resp 16   Ht 5' 2.75" (1.594 m)   Wt 176 lb (79.8 kg)   SpO2 96%   BMI 31.43 kg/m  General: Well developed, NAD, BMI noted Neck: No   thyromegaly  HEENT:  Normocephalic . Face symmetric, atraumatic Lungs:  CTA B Normal respiratory effort, no intercostal retractions, no accessory muscle use. Heart: RRR,  no murmur.  Abdomen:  Not distended, soft, non-tender. No rebound or rigidity.   Lower extremities: no pretibial edema bilaterally  Skin: Exposed areas without rash. Not pale. Not jaundice Neurologic:  alert & oriented X3.  Speech normal, gait appropriate for age and unassisted Strength symmetric and appropriate for age.  Psych: Cognition and judgment appear intact.  Cooperative with normal attention span and concentration.  Behavior appropriate. No anxious or depressed appearing.     Assessment    Assessment Hyperlipidemia H/o Asthma Seasonal allergies Vitamin D deficiency Sickle cell trait History of seizures LFTs elevated:  --Hep B and C:  (-) 2017 --01-2017: wnl  AMA, alpha 1 antitrypsin, SPEP, iron studies  --Abdominal US  01-2017: fatty liver, one gallbladder polyp( no f/u  per guidelines) H/o Depression   PLAN:  Here for CPX Anemia: Diet controlled, last FLP okay, rechecking. Vitamin D deficiency: On OTCs, request labs.  Will do. Seizures: No seizure activity, refills per PCP at neurology request.  RF as needed, checking Keppra levels. RTC 1 year  -Td  2015 - PNM 23: 11-2015 -Covid unvaccinated, pro >> cons again discussed.  Declines. - flu shot  rec q. year -Female care: - per  Dr Seymour Bars; s/p hysterectomy, no oophorectomy.   -MMG 02-2021, likes to check MMG q 2 years -CCS: 3 options d/w pt again today, rx ifob --Labs: CMP FLP CBC vitamin D and Keppra levels. -Weight gain noted, she is active at work, diet discussed.    ===== Seizures: Had a seizure 06-2019, saw neurology, Keppra levels were low, dose increased to 3 tablets B.I.D., she is also on gabapentin.  No seizures since then.  Neurology requested me to refill Keppra, I will.  However, if she has any neurological or seizure problem  needs to reach out to neurology. L great toe numbness: Findings are very subtle, if further investigation is needed she needs to see neurology.  Definitely proceed if symptoms increase. Vitamin D deficiency: S/p ergo, now on OTCs, checking levels Anemia?  Patient liked to donate blood few weeks ago, was told she did not qualify, anemia?  She had a hysterectomy, no GI symptoms.  Checking iron and ferritin. RTC 1 year.

## 2022-01-27 NOTE — Patient Instructions (Signed)
    GO TO THE LAB : Get the blood work     GO TO THE FRONT DESK, PLEASE SCHEDULE YOUR APPOINTMENTS Come back for a physical exam in 1 year.  Call sooner if needed

## 2022-01-28 ENCOUNTER — Encounter: Payer: Self-pay | Admitting: Internal Medicine

## 2022-01-28 NOTE — Assessment & Plan Note (Signed)
Here for CPX Hyperlipidemia: Diet controlled, check labs. Vitamin D deficiency: On OTCs, request labs.  Will do. Seizures: No seizure activity, meds refills per PCP at neurology request.  RF as needed, checking Keppra levels. RTC 1 year

## 2022-01-28 NOTE — Assessment & Plan Note (Signed)
-  Td 2015 - PNM 23: 11-2015 -Covid unvaccinated, pro >> cons again discussed.  Declines. - flu shot  rec q. year -Female care: - per  Dr Seymour Bars; s/p hysterectomy, no oophorectomy.   -MMG 02-2021, likes to check MMG q 2 years -CCS: 3 options d/w pt again today, rx ifob --Labs: CMP FLP CBC vitamin D and Keppra levels. -Weight gain noted, she is active at work, diet discussed.

## 2022-02-03 LAB — LEVETIRACETAM LEVEL: Keppra (Levetiracetam): 9 ug/mL — ABNORMAL LOW

## 2022-02-26 ENCOUNTER — Encounter: Payer: Self-pay | Admitting: Internal Medicine

## 2022-06-16 IMAGING — MG DIGITAL DIAGNOSTIC BILAT W/ TOMO W/ CAD
8 series · 8 of 24 positions shown · non-contrast
Comparison: Previous exam(s).

CLINICAL DATA: 45-year-old female presenting for diffuse
intermittent lateral left breast pain. No associated lump.

EXAM:
DIGITAL DIAGNOSTIC BILATERAL MAMMOGRAM WITH TOMOSYNTHESIS AND CAD
TECHNIQUE: Bilateral digital diagnostic mammography and breast tomosynthesis
was performed. The images were evaluated with computer-aided
detection.

[L MLO synth-2D]
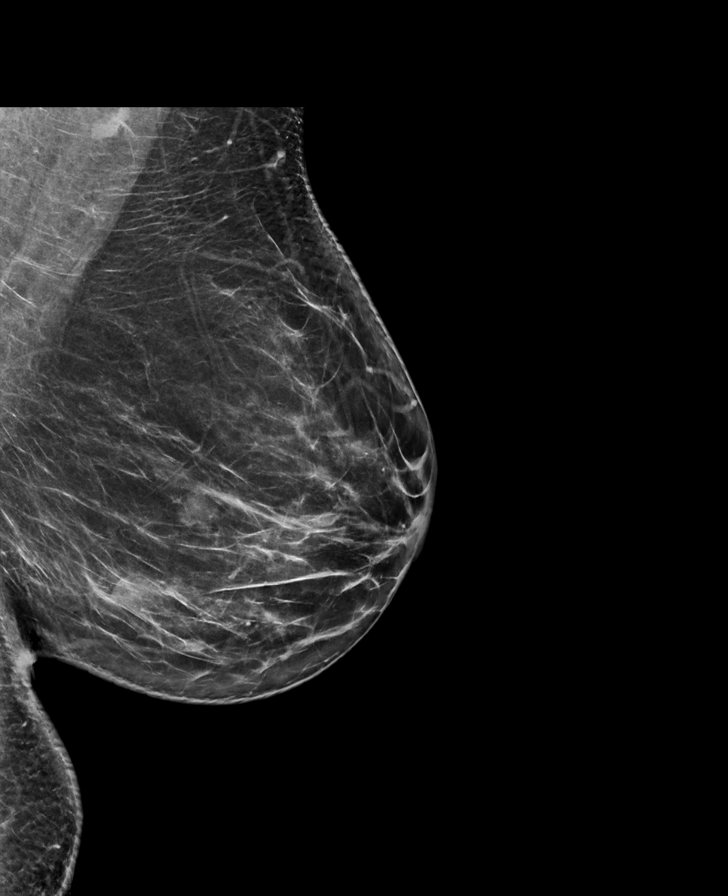

[R MLO synth-2D]
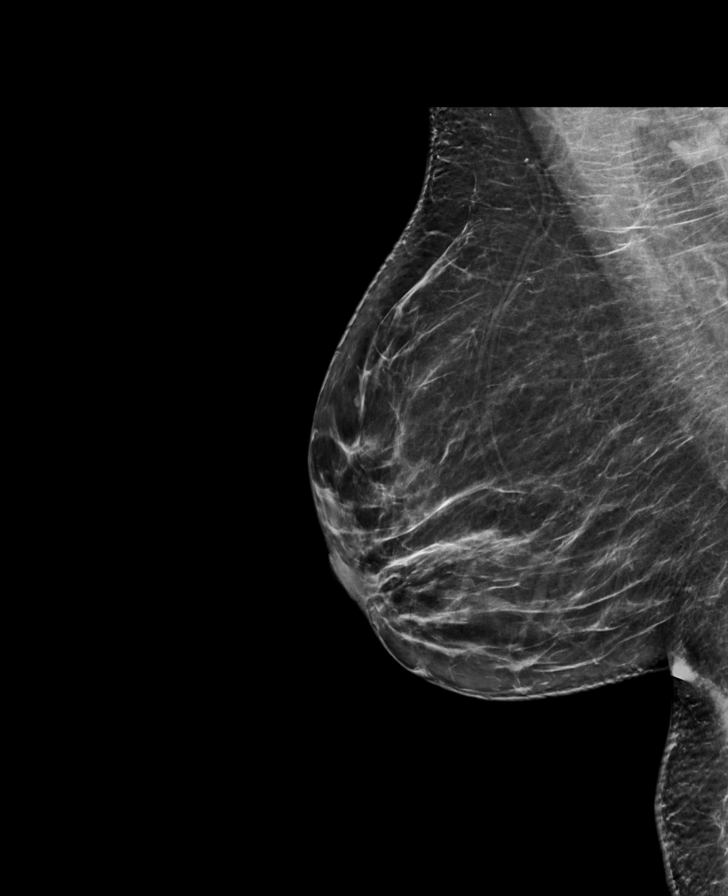

[L CC synth-2D]
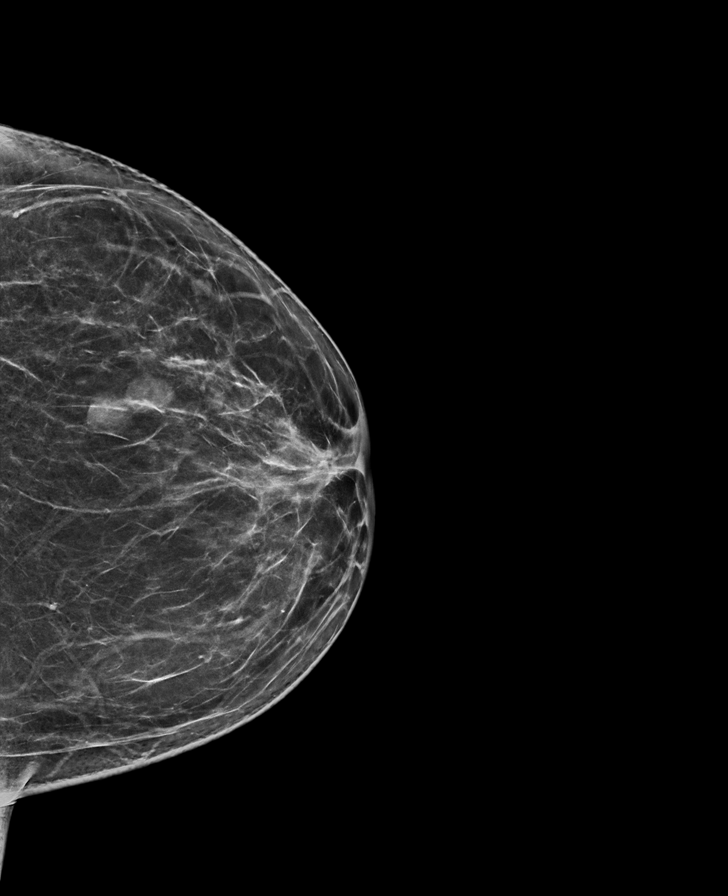

[R CC synth-2D]
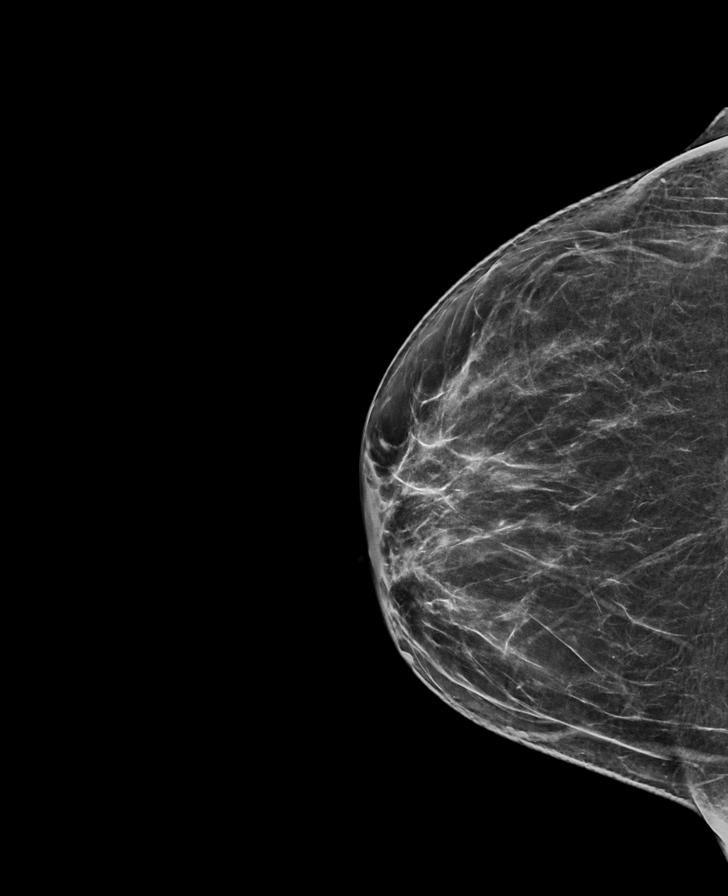

[R MLO tomo · tomo slice 41/81.0]
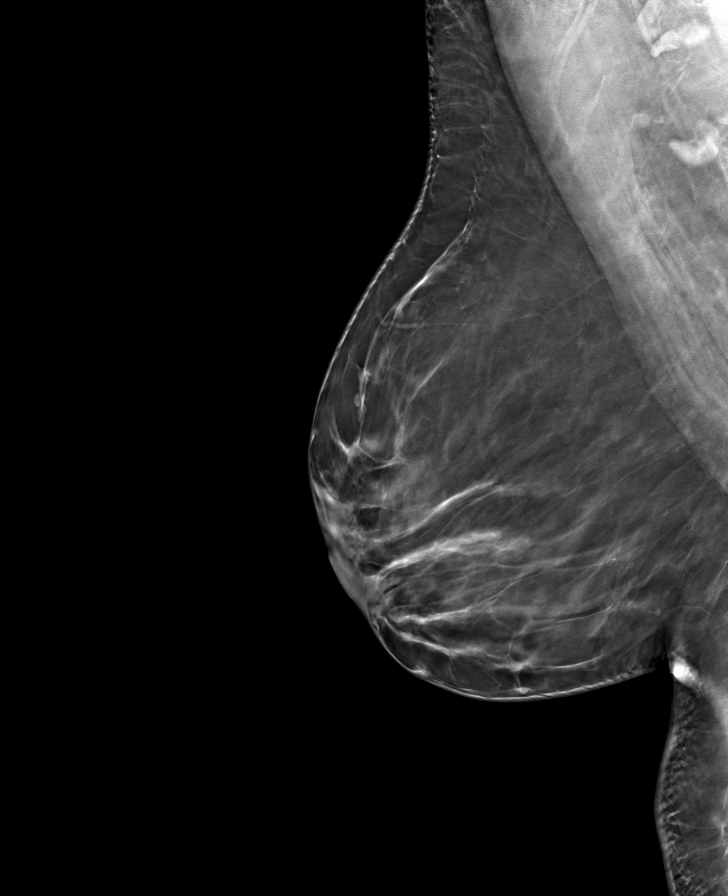

[L CC tomo · tomo slice 39/78.0]
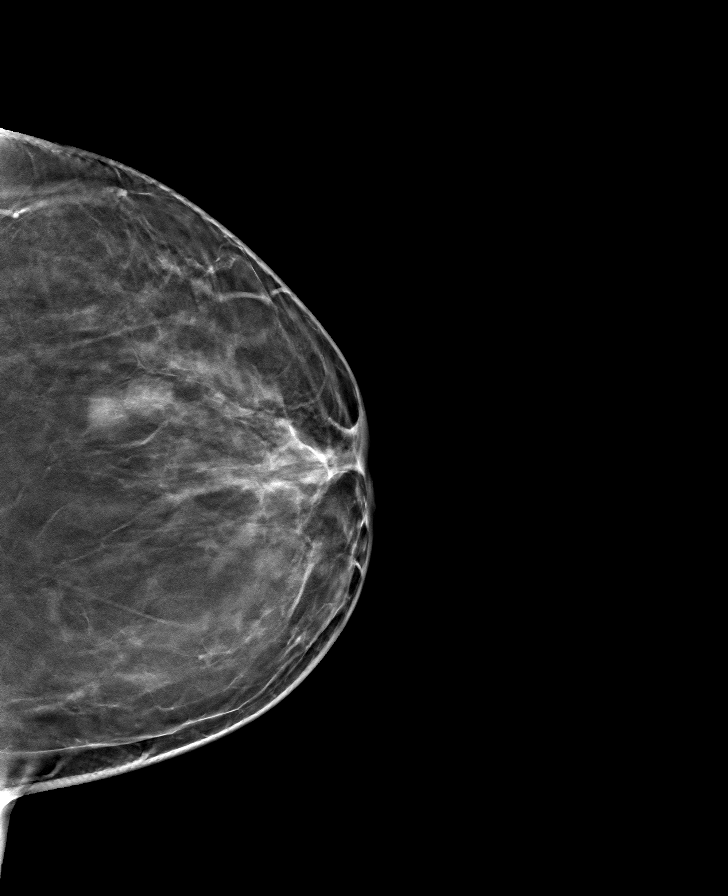

[R CC tomo · tomo slice 38/75.0]
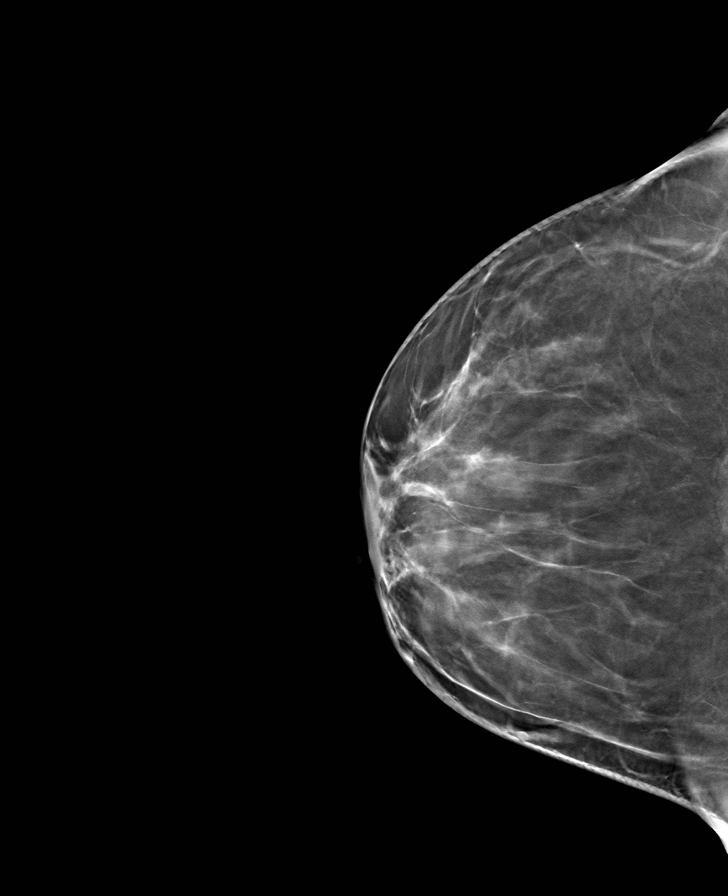

[L MLO tomo · tomo slice 44/87.0]
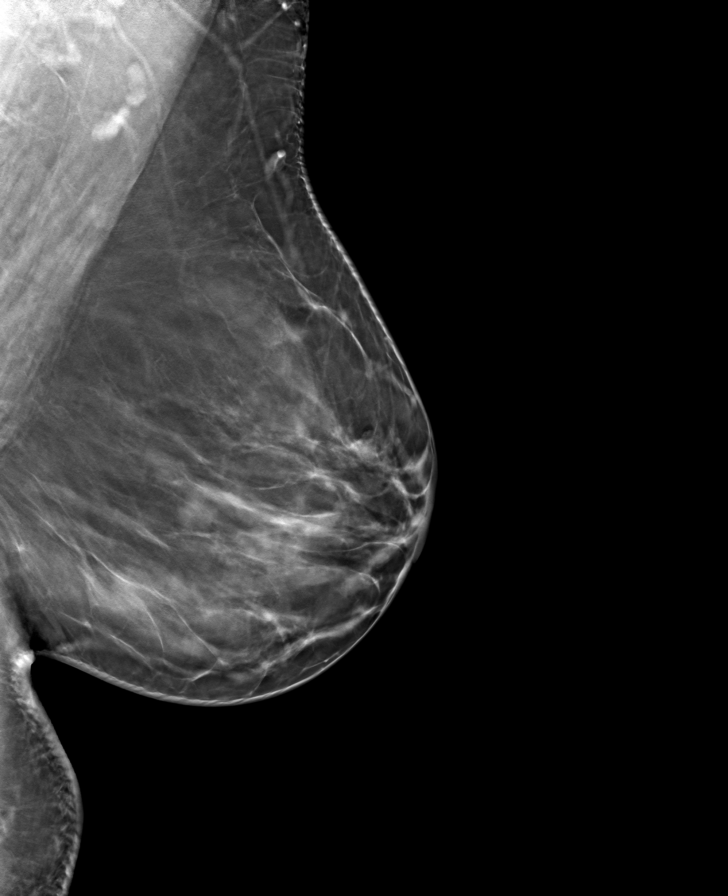

[8 of 24 positions shown; findings below may reference images not displayed]

ACR Breast Density Category b: There are scattered areas of
fibroglandular density.
FINDINGS: Right breast: No suspicious mass, distortion, or microcalcifications
are identified to suggest presence of malignancy.

Left breast: No suspicious mass, distortion, or microcalcifications
are identified to suggest presence of malignancy. Specifically there
is no new finding to explain the patient's diffuse left breast pain.
IMPRESSION: No mammographic evidence of malignancy bilaterally or other imaging
finding to explain the patient's diffuse left breast pain.

RECOMMENDATION:
1. Clinical follow-up as needed for the left breast pain. Breast
pain is a common condition, which will often resolve on its own
without intervention. It can be affected by hormonal changes,
medication side effect, weight changes and fit of the bra. Pain may
also be referred from other adjacent areas of the body. Breast pain
may be improved by wearing adequate well-fitting support,
over-the-counter topical and oral NSAID medication, low-fat diet,
and ice/heat as needed. Studies have shown an improvement in cyclic
pain with use of evening primrose oil and vitamin E.

2.  Screening mammogram in one year.(Code:[id number])

I have discussed the findings and recommendations with the patient.
If applicable, a reminder letter will be sent to the patient
regarding the next appointment.

BI-RADS CATEGORY  1: Negative.

## 2022-06-22 ENCOUNTER — Other Ambulatory Visit: Payer: Self-pay | Admitting: Internal Medicine

## 2022-06-24 ENCOUNTER — Other Ambulatory Visit: Payer: Self-pay | Admitting: Internal Medicine

## 2022-09-04 ENCOUNTER — Encounter: Payer: Self-pay | Admitting: Internal Medicine

## 2023-01-29 ENCOUNTER — Ambulatory Visit (INDEPENDENT_AMBULATORY_CARE_PROVIDER_SITE_OTHER): Payer: Self-pay | Admitting: Internal Medicine

## 2023-01-29 ENCOUNTER — Encounter: Payer: Self-pay | Admitting: Internal Medicine

## 2023-01-29 VITALS — BP 122/68 | HR 70 | Temp 98.1°F | Resp 16 | Ht 62.75 in | Wt 176.2 lb

## 2023-01-29 DIAGNOSIS — G40909 Epilepsy, unspecified, not intractable, without status epilepticus: Secondary | ICD-10-CM

## 2023-01-29 DIAGNOSIS — Z0000 Encounter for general adult medical examination without abnormal findings: Secondary | ICD-10-CM

## 2023-01-29 DIAGNOSIS — E785 Hyperlipidemia, unspecified: Secondary | ICD-10-CM

## 2023-01-29 DIAGNOSIS — E559 Vitamin D deficiency, unspecified: Secondary | ICD-10-CM

## 2023-01-29 LAB — CBC WITH DIFFERENTIAL/PLATELET
Basophils Absolute: 0 10*3/uL (ref 0.0–0.1)
Basophils Relative: 1 % (ref 0.0–3.0)
Eosinophils Absolute: 0.1 10*3/uL (ref 0.0–0.7)
Eosinophils Relative: 1.4 % (ref 0.0–5.0)
HCT: 38.9 % (ref 36.0–46.0)
Hemoglobin: 13.1 g/dL (ref 12.0–15.0)
Lymphocytes Relative: 36.6 % (ref 12.0–46.0)
Lymphs Abs: 1.6 10*3/uL (ref 0.7–4.0)
MCHC: 33.6 g/dL (ref 30.0–36.0)
MCV: 89.8 fl (ref 78.0–100.0)
Monocytes Absolute: 0.2 10*3/uL (ref 0.1–1.0)
Monocytes Relative: 4.8 % (ref 3.0–12.0)
Neutro Abs: 2.5 10*3/uL (ref 1.4–7.7)
Neutrophils Relative %: 56.2 % (ref 43.0–77.0)
Platelets: 262 10*3/uL (ref 150.0–400.0)
RBC: 4.33 Mil/uL (ref 3.87–5.11)
RDW: 13 % (ref 11.5–15.5)
WBC: 4.5 10*3/uL (ref 4.0–10.5)

## 2023-01-29 LAB — COMPREHENSIVE METABOLIC PANEL
ALT: 56 U/L — ABNORMAL HIGH (ref 0–35)
AST: 33 U/L (ref 0–37)
Albumin: 4.3 g/dL (ref 3.5–5.2)
Alkaline Phosphatase: 76 U/L (ref 39–117)
BUN: 9 mg/dL (ref 6–23)
CO2: 29 meq/L (ref 19–32)
Calcium: 9.3 mg/dL (ref 8.4–10.5)
Chloride: 105 meq/L (ref 96–112)
Creatinine, Ser: 0.56 mg/dL (ref 0.40–1.20)
GFR: 108.73 mL/min (ref >60.00)
Glucose, Bld: 98 mg/dL (ref 70–99)
Potassium: 3.6 meq/L (ref 3.5–5.1)
Sodium: 142 meq/L (ref 135–145)
Total Bilirubin: 0.6 mg/dL (ref 0.2–1.2)
Total Protein: 7 g/dL (ref 6.0–8.3)

## 2023-01-29 LAB — LIPID PANEL
Cholesterol: 234 mg/dL — ABNORMAL HIGH (ref 0–200)
HDL: 38 mg/dL — ABNORMAL LOW (ref >39.00)
LDL Cholesterol: 138 mg/dL — ABNORMAL HIGH (ref 0–99)
NonHDL: 195.64
Total CHOL/HDL Ratio: 6
Triglycerides: 288 mg/dL — ABNORMAL HIGH (ref 0.0–149.0)
VLDL: 57.6 mg/dL — ABNORMAL HIGH (ref 0.0–40.0)

## 2023-01-29 LAB — VITAMIN D 25 HYDROXY (VIT D DEFICIENCY, FRACTURES): VITD: 45.48 ng/mL (ref 30.00–100.00)

## 2023-01-29 NOTE — Assessment & Plan Note (Signed)
Here for CPX -Td 2015 - PNM 23: 11-2015 -Vaccine advised: Flu shot every fall, COVID-vaccine. - Female care: - per  Dr Seymour Bars; s/p hysterectomy, no oophorectomy.   -MMG up to date? Rec to proceed  -CCS: failed to return  Ifob last year,  3 options discussed again. Elected an Ifob --Labs:  CMP FLP CBC vitamin D Keppra level -Diet and exercise discussed.

## 2023-01-29 NOTE — Assessment & Plan Note (Signed)
Here for CPX Hyperlipidemia: Diet controlled, check labs Vitamin D deficiency, on supplements, checking levels. Seizures: No activity since LOV, continue Keppra, gabapentin, checking Keppra levels. RTC 1 year

## 2023-01-29 NOTE — Patient Instructions (Addendum)
Vaccines I recommend: Tdap (tetanus) Flu shot this fall  Please return the stool test at your earliest convenience Traiga de regreso la prueba de heces   Schedule a  mammogram  GO TO THE LAB : Get the blood work     GO TO THE FRONT DESK, PLEASE SCHEDULE YOUR APPOINTMENTS Come back for a physical exam in 1 year

## 2023-01-29 NOTE — Progress Notes (Signed)
Subjective:    Patient ID: Beth Blackwell, female    DOB: 1976-01-09, 47 y.o.   MRN: [id number]  DOS:  01/29/2023 Type of visit - description: CPX  Here for CPX. Feels well. Admits to some anxiety and occasional palpitations, usually at rest. Wonders about perimenopause, occasionally feels hot flashes at night.  No actual fever or chills.  No weight loss.  No GI or GU symptoms.  Wt Readings from Last 3 Encounters:  01/29/23 176 lb 4 oz (79.9 kg)  01/27/22 176 lb (79.8 kg)  09/01/21 173 lb (78.5 kg)     Review of Systems  Other than above, a 14 point review of systems is negative     Past Medical History:  Diagnosis Date   Depression    High cholesterol    Retained orthopedic hardware 02/2014   right wrist/forearm   Seasonal allergies    Seizures (HCC)    no seizures  since ~ 2012   Sickle cell trait (HCC)    Varicose vein of leg    Vitamin D deficiency     Past Surgical History:  Procedure Laterality Date   ABDOMINAL HYSTERECTOMY  12/28/2005   NO oophorectomy   APPENDECTOMY     CESAREAN SECTION     x 2   HARDWARE REMOVAL Right 03/09/2014   Procedure: REMOVAL PLATE AND SCREW RIGHT ULNAR;  Surgeon: Cindee Salt, MD;  Location: [facility];  Service: Orthopedics;  Laterality: Right;   LAPAROSCOPIC LYSIS OF ADHESIONS  09/22/2006   abdominopelvic   OVARIAN CYST REMOVAL Right 09/22/2006   TUBAL LIGATION  02/06/2000   WRIST ARTHROSCOPY WITH ULNA SHORTENING Right 06/25/2010   and TFCC debridement, shrinkage scapholunate ligament   Social History   Socioeconomic History   Marital status: Married    Spouse name: Not on file   Number of children: 2   Years of education: Not on file   Highest education level: Not on file  Occupational History   Occupation: cleaning   Tobacco Use   Smoking status: Never   Smokeless tobacco: Never  Vaping Use   Vaping status: Never Used  Substance and Sexual Activity   Alcohol use: No    Alcohol/week: 0.0 standard  drinks of alcohol   Drug use: No   Sexual activity: Yes    Partners: Male    Birth control/protection: Surgical    Comment: first intercourse at 37, married 25.  Other Topics Concern   Not on file  Social History Narrative   Original from [place], [place]   Household-- pt, husband, daughter    2 children (son 60, daughter 36- college in [place])   Social Determinants of Health   Financial Resource Strain: Not on file  Food Insecurity: No Food Insecurity (02/13/2021)   Hunger Vital Sign    Worried About Running Out of Food in the Last Year: Never true    Ran Out of Food in the Last Year: Never true  Transportation Needs: No Transportation Needs (02/13/2021)   PRAPARE - Administrator, Civil Service (Medical): No    Lack of Transportation (Non-Medical): No  Physical Activity: Not on file  Stress: Not on file  Social Connections: Not on file  Intimate Partner Violence: Not on file     Current Outpatient Medications  Medication Instructions   Ascorbic Acid (VITAMIN C PO) Oral   Calcium Carbonate-Vitamin D 600-400 MG-UNIT tablet 1 tablet, Oral, Daily,     cetirizine (ZYRTEC) 10 mg,  Daily   cholecalciferol (VITAMIN D3) 1,000 Units, Oral, Daily   gabapentin (NEURONTIN) 600 mg, Oral, 3 times daily   levETIRAcetam (KEPPRA) 1,500 mg, Oral, 2 times daily       Objective:   Physical Exam BP 122/68   Pulse 70   Temp 98.1 F (36.7 C) (Oral)   Resp 16   Ht 5' 2.75" (1.594 m)   Wt 176 lb 4 oz (79.9 kg)   SpO2 96%   BMI 31.47 kg/m  General:   Well developed, NAD, BMI noted.  HEENT:  Normocephalic . Face symmetric, atraumatic Lungs:  CTA B Normal respiratory effort, no intercostal retractions, no accessory muscle use. Heart: RRR,  no murmur.  Abdomen:  Not distended, soft, non-tender. No rebound or rigidity.   Skin: Not pale. Not jaundice Lower extremities: no pretibial edema bilaterally  Neurologic:  alert & oriented X3.  Speech normal, gait  appropriate for age and unassisted Psych--  Cognition and judgment appear intact.  Cooperative with normal attention span and concentration.  Behavior appropriate. No anxious or depressed appearing.     Assessment    Assessment Hyperlipidemia H/o Asthma Seasonal allergies Vitamin D deficiency Sickle cell trait History of seizures LFTs elevated:  --Hep B and C:  (-) 2017 --01-2017: wnl  AMA, alpha 1 antitrypsin, SPEP, iron studies  --Abdominal US  01-2017: fatty liver, one gallbladder polyp( no f/u  per guidelines) H/o Depression   PLAN: Here for CPX -Td 2015 - PNM 23: 11-2015 -Vaccine advised: Flu shot every fall, COVID-vaccine. - Female care: - per  Dr Seymour Bars; s/p hysterectomy, no oophorectomy.   -MMG up to date? Rec to proceed  -CCS: failed to return  Ifob last year,  3 options discussed again. Elected an Ifob --Labs:  CMP FLP CBC vitamin D Keppra level -Diet and exercise discussed. Hyperlipidemia: Diet controlled, check labs Vitamin D deficiency, on supplements, checking levels. Seizures: No activity since LOV, continue Keppra, gabapentin, checking Keppra levels. RTC 1 year

## 2023-02-02 LAB — LEVETIRACETAM LEVEL: Keppra (Levetiracetam): 11.9 ug/mL — ABNORMAL LOW

## 2023-03-01 ENCOUNTER — Encounter: Payer: Self-pay | Admitting: Internal Medicine

## 2023-03-05 ENCOUNTER — Other Ambulatory Visit: Payer: Self-pay

## 2023-03-05 DIAGNOSIS — Z1231 Encounter for screening mammogram for malignant neoplasm of breast: Secondary | ICD-10-CM

## 2023-03-17 ENCOUNTER — Other Ambulatory Visit: Payer: Self-pay

## 2023-03-17 DIAGNOSIS — N6320 Unspecified lump in the left breast, unspecified quadrant: Secondary | ICD-10-CM

## 2023-03-25 ENCOUNTER — Ambulatory Visit: Payer: Self-pay | Admitting: Hematology and Oncology

## 2023-03-25 VITALS — BP 141/89 | Wt 173.0 lb

## 2023-03-25 DIAGNOSIS — Z1211 Encounter for screening for malignant neoplasm of colon: Secondary | ICD-10-CM

## 2023-03-25 MED ORDER — CEPHALEXIN 500 MG PO CAPS: 500.0000 mg | ORAL_CAPSULE | Freq: Two times a day (BID) | ORAL | 0 refills | Status: DC

## 2023-03-25 NOTE — Patient Instructions (Signed)
Taught Buddy Duty about self breast awareness and gave educational materials to take home. Patient did not need a Pap smear today due to hysterectomy. Referred patient to the [facility] for diagnostic mammogram. Appointment scheduled for 03/25/2023. Patient aware of appointment and will be there. Let patient know will follow up with her within the next couple weeks with results. Rudene Christians Bravo verbalized understanding.  Pascal Lux, NP 3:20 PM

## 2023-03-25 NOTE — Progress Notes (Signed)
[name] is a 47 y.o. female who presents to [facility] clinic today with complaint of left breast lump.    Pap Smear: Pap not smear completed today due to hysterectomy.    Physical exam: Breasts Breasts symmetrical. No skin abnormalities bilateral breasts. No nipple retraction bilateral breasts. No nipple discharge bilateral breasts. No lymphadenopathy. No lumps palpated right breast. Left breast with mobile, painful lump noted to the lower, outer quadrant. This area is warm to touch and red.      Pelvic/Bimanual Pap is not indicated today    Smoking History: Patient has never smoked and was not referred to quit line.    Patient Navigation: Patient education provided. Access to services provided for patient through BCCCP program. Beth Blackwell interpreter provided. No transportation provided   Colorectal Cancer Screening: Per patient has never had colonoscopy completed No complaints today. FIT test given.  Breast and Cervical Cancer Risk Assessment: Patient does not have family history of breast cancer, known genetic mutations, or radiation treatment to the chest before age 55. Patient does not have history of cervical dysplasia, immunocompromised, or DES exposure in-utero.  Risk Scores as of Encounter on 03/25/2023     Beth Blackwell           5-year 0.57%   Lifetime 6.23%   This patient is Hisp[place]/Latina but has no documented birth country, so the [place] model used data from [place] patients to calculate their risk score. Document a birth country in the Demographics activity for a more accurate score.         Last calculated by Caprice Red, CMA on 03/25/2023 at  3:23 PM          A: BCCCP exam without pap smear Complaint of left breast lump with redness and warm to touch. Will send in antibiotics today.   P: Referred patient to the [facility] for a diagnostic mammogram. Appointment scheduled 04/06/2023.  Pascal Lux, NP 03/25/2023 3:19 PM

## 2023-04-01 LAB — FECAL OCCULT BLOOD, IMMUNOCHEMICAL: Fecal Occult Bld: NEGATIVE

## 2023-04-06 ENCOUNTER — Ambulatory Visit
Admission: RE | Admit: 2023-04-06 | Discharge: 2023-04-06 | Disposition: A | Discharge status: Home / Self Care | Payer: Self-pay | Source: Ambulatory Visit | Attending: Obstetrics and Gynecology | Admitting: Obstetrics and Gynecology

## 2023-04-06 ENCOUNTER — Other Ambulatory Visit: Payer: Self-pay | Admitting: Obstetrics and Gynecology

## 2023-04-06 ENCOUNTER — Ambulatory Visit
Admission: RE | Admit: 2023-04-06 | Discharge: 2023-04-06 | Disposition: A | Discharge status: Home / Self Care | Payer: No Typology Code available for payment source | Source: Ambulatory Visit | Attending: Obstetrics and Gynecology | Admitting: Obstetrics and Gynecology

## 2023-04-06 ENCOUNTER — Encounter: Payer: Self-pay | Admitting: Obstetrics and Gynecology

## 2023-04-06 DIAGNOSIS — N6320 Unspecified lump in the left breast, unspecified quadrant: Secondary | ICD-10-CM

## 2023-04-06 DIAGNOSIS — N6002 Solitary cyst of left breast: Secondary | ICD-10-CM

## 2023-04-07 ENCOUNTER — Inpatient Hospital Stay
Admission: RE | Admit: 2023-04-07 | Discharge: 2023-04-07 | Discharge status: Home / Self Care | Payer: No Typology Code available for payment source | Source: Ambulatory Visit | Attending: Obstetrics and Gynecology | Admitting: Obstetrics and Gynecology

## 2023-04-07 DIAGNOSIS — N6002 Solitary cyst of left breast: Secondary | ICD-10-CM

## 2023-04-28 ENCOUNTER — Ambulatory Visit: Payer: Self-pay | Admitting: Obstetrics and Gynecology

## 2023-05-06 ENCOUNTER — Ambulatory Visit: Payer: Self-pay

## 2023-06-12 ENCOUNTER — Other Ambulatory Visit: Payer: Self-pay | Admitting: Internal Medicine

## 2023-08-04 ENCOUNTER — Ambulatory Visit (INDEPENDENT_AMBULATORY_CARE_PROVIDER_SITE_OTHER): Payer: Self-pay | Admitting: Obstetrics and Gynecology

## 2023-08-04 ENCOUNTER — Encounter: Payer: Self-pay | Admitting: Obstetrics and Gynecology

## 2023-08-04 VITALS — BP 132/82 | HR 84 | Ht 62.5 in | Wt 173.0 lb

## 2023-08-04 DIAGNOSIS — Z01419 Encounter for gynecological examination (general) (routine) without abnormal findings: Secondary | ICD-10-CM

## 2023-08-04 NOTE — Progress Notes (Signed)
 48 y.o. y.o. female here for annual exam. No LMP recorded. Patient has had a hysterectomy.    G2P2L2 Married.  Son in 59 yo in [place].  Daughter 57 yo at [employer] in [place], children[place].   RP:  Established patient presenting for annual gyn exam    HPI: S/P TAH 2007 for menorrhagia.  No pelvic pain.  No GU/GI complaints.  No h/o abnormal Pap, last Pap Neg in 2012. Pap smear screening not needed. Married same partner.    BMI 30.89 last visit.  Needs to increase fitness activities and decrease Calories/Carbs.  Health labs with Dr Drue Novel. 04/07/23 breast cyst aspiration x2 with clip placement Colo: fecal occult blood neg 03/29/23 Self pay Dxa not done. No HRT no complaints of hot flashes   Past medical history,surgical history, family history and social history were all reviewed and documented in the EPIC chart. Body mass index is 31.14 kg/m.     08/04/2023    8:32 AM 01/29/2023    9:02 AM 01/27/2022    8:48 AM  Depression screen PHQ 2/9  Decreased Interest 0 0 0  Down, Depressed, Hopeless 0 0 0  PHQ - 2 Score 0 0 0    Blood pressure 132/82, pulse 84, height 5' 2.5" (1.588 m), weight 173 lb (78.5 kg), SpO2 95%.  No results found for: "DIAGPAP", "HPVHIGH", "ADEQPAP"  GYN HISTORY: No results found for: "DIAGPAP", "HPVHIGH", "ADEQPAP"  OB History  Gravida Para Term Preterm AB Living  2 2    2   SAB IAB Ectopic Multiple Live Births          # Outcome Date GA Lbr Len/2nd Weight Sex Type Anes PTL Lv  2 Para      CS-LTranv     1 Para      CS-LTranv       Past Medical History:  Diagnosis Date   Depression    High cholesterol    Retained orthopedic hardware 02/2014   right wrist/forearm   Seasonal allergies    Seizures (HCC)    no seizures  since ~ 2012   Sickle cell trait (HCC)    Varicose vein of leg    Vitamin D deficiency     Past Surgical History:  Procedure Laterality Date   ABDOMINAL HYSTERECTOMY  12/28/2005   NO oophorectomy   APPENDECTOMY     CESAREAN SECTION      x 2   HARDWARE REMOVAL Right 03/09/2014   Procedure: REMOVAL PLATE AND SCREW RIGHT ULNAR;  Surgeon: Cindee Salt, MD;  Location: [facility];  Service: Orthopedics;  Laterality: Right;   LAPAROSCOPIC LYSIS OF ADHESIONS  09/22/2006   abdominopelvic   OVARIAN CYST REMOVAL Right 09/22/2006   TUBAL LIGATION  02/06/2000   WRIST ARTHROSCOPY WITH ULNA SHORTENING Right 06/25/2010   and TFCC debridement, shrinkage scapholunate ligament    Current Outpatient Medications on File Prior to Visit  Medication Sig Dispense Refill   Ascorbic Acid (VITAMIN C PO) Take by mouth.     Calcium Carbonate-Vitamin D 600-400 MG-UNIT tablet Take 1 tablet by mouth daily.     cetirizine (ZYRTEC) 10 MG tablet Take 10 mg by mouth daily.     gabapentin (NEURONTIN) 600 MG tablet Take 1 tablet (600 mg total) by mouth 3 (three) times daily. 270 tablet 2   levETIRAcetam (KEPPRA) 500 MG tablet Take 3 tablets (1,500 mg total) by mouth 2 (two) times daily. 540 tablet 2   Vitamin D, Ergocalciferol, (DRISDOL) 1.25 MG (  50000 UNIT) CAPS capsule Take 50,000 Units by mouth every 7 (seven) days.     No current facility-administered medications on file prior to visit.    Social History   Socioeconomic History   Marital status: Married    Spouse name: Not on file   Number of children: 2   Years of education: Not on file   Highest education level: Not on file  Occupational History   Occupation: cleaning   Tobacco Use   Smoking status: Never   Smokeless tobacco: Never  Vaping Use   Vaping status: Never Used  Substance and Sexual Activity   Alcohol use: No    Alcohol/week: 0.0 standard drinks of alcohol   Drug use: No   Sexual activity: Yes    Partners: Male    Birth control/protection: Surgical    Comment: first intercourse at 69, married 25. Partial hysterectomy. Pt has her ovaries.  Other Topics Concern   Not on file  Social History Narrative   Original from [place], [place]   Household-- pt, husband,  daughter    2 children (son 17, daughter 34- college in [place])   Social Drivers of Corporate investment banker Strain: Not on file  Food Insecurity: No Food Insecurity (03/25/2023)   Hunger Vital Sign    Worried About Running Out of Food in the Last Year: Never true    Ran Out of Food in the Last Year: Never true  Transportation Needs: No Transportation Needs (03/25/2023)   PRAPARE - Administrator, Civil Service (Medical): No    Lack of Transportation (Non-Medical): No  Physical Activity: Not on file  Stress: Not on file  Social Connections: Not on file  Intimate Partner Violence: Not on file    Family History  Adopted: Yes  Problem Relation Age of Onset   Diabetes Father    Hypertension Father    Hypertension Sister    Seizures Neg Hx    Colon cancer Neg Hx    Breast cancer Neg Hx      Allergies  Allergen Reactions   Acetaminophen Shortness Of Breath      Patient's last menstrual period was No LMP recorded. Patient has had a hysterectomy..           Review of Systems Alls systems reviewed and are negative.     Physical Exam Constitutional:      Appearance: Normal appearance.  Genitourinary:     Vulva normal.     No lesions in the vagina.     Right Labia: No rash, lesions or skin changes.    Left Labia: No lesions, skin changes or rash.    Vaginal cuff intact.    No vaginal discharge or tenderness.     No vaginal prolapse present.    No vaginal atrophy present.     Right Adnexa: not tender and no mass present.    Left Adnexa: not tender and no mass present.    Cervix is absent.     Uterus is absent.  Breasts:    Right: Normal.     Left: Normal.  HENT:     Head: Normocephalic.  Neck:     Thyroid: No thyroid mass, thyromegaly or thyroid tenderness.  Cardiovascular:     Rate and Rhythm: Normal rate and regular rhythm.     Heart sounds: Normal heart sounds, S1 normal and S2 normal.  Pulmonary:     Effort: Pulmonary effort is  normal.     Breath  sounds: Normal breath sounds and air entry.  Abdominal:     General: There is no distension.     Palpations: Abdomen is soft. There is no mass.     Tenderness: There is no abdominal tenderness. There is no guarding or rebound.  Musculoskeletal:        General: Normal range of motion.     Cervical back: Full passive range of motion without pain, normal range of motion and neck supple. No tenderness.     Right lower leg: No edema.     Left lower leg: No edema.  Neurological:     Mental Status: She is alert.  Skin:    General: Skin is warm.  Psychiatric:        Mood and Affect: Mood normal.        Behavior: Behavior normal.        Thought Content: Thought content normal.  Vitals and nursing note reviewed. Exam conducted with a chaperone present.       A:         Well Woman GYN exam                             P:        Pap smear not indicated Encouraged annual mammogram screening Colon cancer screening up-to-date DXA not indicated Labs and immunizations to do with PMD Discussed breast self exams Encouraged healthy lifestyle practices Encouraged Vit D and Calcium   No follow-ups on file.  Earley Favor

## 2024-01-14 ENCOUNTER — Ambulatory Visit (INDEPENDENT_AMBULATORY_CARE_PROVIDER_SITE_OTHER): Payer: Self-pay | Admitting: Physician Assistant

## 2024-01-14 ENCOUNTER — Encounter: Payer: Self-pay | Admitting: Physician Assistant

## 2024-01-14 ENCOUNTER — Ambulatory Visit: Payer: Self-pay

## 2024-01-14 VITALS — BP 162/96 | HR 80 | Ht 62.5 in | Wt 178.4 lb

## 2024-01-14 DIAGNOSIS — R002 Palpitations: Secondary | ICD-10-CM

## 2024-01-14 DIAGNOSIS — I10 Essential (primary) hypertension: Secondary | ICD-10-CM

## 2024-01-14 LAB — CBC WITH DIFFERENTIAL/PLATELET
Basophils Absolute: 0.1 K/uL (ref 0.0–0.1)
Basophils Relative: 1.1 % (ref 0.0–3.0)
Eosinophils Absolute: 0.1 K/uL (ref 0.0–0.7)
Eosinophils Relative: 1.4 % (ref 0.0–5.0)
HCT: 38.4 % (ref 36.0–46.0)
Hemoglobin: 13 g/dL (ref 12.0–15.0)
Lymphocytes Relative: 37.8 % (ref 12.0–46.0)
Lymphs Abs: 1.9 K/uL (ref 0.7–4.0)
MCHC: 33.8 g/dL (ref 30.0–36.0)
MCV: 89 fl (ref 78.0–100.0)
Monocytes Absolute: 0.2 K/uL (ref 0.1–1.0)
Monocytes Relative: 4.4 % (ref 3.0–12.0)
Neutro Abs: 2.7 K/uL (ref 1.4–7.7)
Neutrophils Relative %: 55.3 % (ref 43.0–77.0)
Platelets: 232 K/uL (ref 150.0–400.0)
RBC: 4.31 Mil/uL (ref 3.87–5.11)
RDW: 12.4 % (ref 11.5–15.5)
WBC: 4.9 K/uL (ref 4.0–10.5)

## 2024-01-14 LAB — COMPREHENSIVE METABOLIC PANEL WITH GFR
ALT: 100 U/L — ABNORMAL HIGH (ref 0–35)
AST: 61 U/L — ABNORMAL HIGH (ref 0–37)
Albumin: 4.7 g/dL (ref 3.5–5.2)
Alkaline Phosphatase: 72 U/L (ref 39–117)
BUN: 8 mg/dL (ref 6–23)
CO2: 29 meq/L (ref 19–32)
Calcium: 9.6 mg/dL (ref 8.4–10.5)
Chloride: 104 meq/L (ref 96–112)
Creatinine, Ser: 0.63 mg/dL (ref 0.40–1.20)
GFR: 104.98 mL/min (ref >60.00)
Glucose, Bld: 110 mg/dL — ABNORMAL HIGH (ref 70–99)
Potassium: 3.7 meq/L (ref 3.5–5.1)
Sodium: 142 meq/L (ref 135–145)
Total Bilirubin: 0.6 mg/dL (ref 0.2–1.2)
Total Protein: 7.1 g/dL (ref 6.0–8.3)

## 2024-01-14 LAB — TSH: TSH: 2.58 u[IU]/mL (ref 0.35–5.50)

## 2024-01-14 MED ORDER — VALSARTAN 80 MG PO TABS: 80.0000 mg | ORAL_TABLET | Freq: Every day | ORAL | 3 refills | Status: AC

## 2024-01-14 NOTE — Telephone Encounter (Signed)
 FYI Only or Action Required?: FYI only for provider.  Patient was last seen in primary care on 01/29/2023 by Amon Aloysius BRAVO, MD.  Called Nurse Triage reporting High BP readings, HA.  Symptoms began several weeks ago.  Interventions attempted: Nothing.  Symptoms are: unchanged.  Triage Disposition: See PCP When Office is Open (Within 3 Days)  Patient/caregiver understands and will follow disposition?: Yes               Copied from CRM #[id number]. Topic: Clinical - Red Word Triage >> Jan 14, 2024  8:28 AM Suzen RAMAN wrote: Red Word that prompted transfer to Nurse Triage: elevated bp x2-3 tzzxd(842/09), headaches Reason for Disposition  Systolic BP >= 160 OR Diastolic >= 100  Answer Assessment - Initial Assessment Questions 1. BLOOD PRESSURE: What is your blood pressure? Did you take at least two measurements 5 minutes apart?     157/90 - this morning  150s-90s 2. ONSET: When did you take your blood pressure?     This morning 3. HOW: How did you take your blood pressure? (e.g., automatic home BP monitor, visiting nurse)     Home automatic 4. HISTORY: Do you have a history of high blood pressure?     no 5. MEDICINES: Are you taking any medicines for blood pressure? Have you missed any doses recently?     no 6. OTHER SYMPTOMS: Do you have any symptoms? (e.g., blurred vision, chest pain, difficulty breathing, headache, weakness)     HA, palpatations  Protocols used: Blood Pressure - High-A-AH

## 2024-01-14 NOTE — Progress Notes (Signed)
 Established patient visit   Patient: Beth Blackwell   DOB: 1975-10-01   48 y.o. Female  MRN: [id number] Visit Date: 01/14/2024  Today's healthcare provider: Manuelita Flatness, PA-C   Cc. Htn, headaches,  Subjective     Pt called as she was experiencing elevated blood pressures, ie 157/90 for the last few weeks w/ intermittent headaches and palpitations. She is not on any regular blood pressure medicine.   Discussed the use of AI scribe software for clinical note transcription with the patient, who gave verbal consent to proceed.  History of Present Illness   Beth Blackwell is a 48 year old female with hypertension who presents with headaches, palpitations, and elevated blood pressure.  For the past three weeks, she has experienced consistent occipital headaches without severe pain or vision changes. She has not taken medication for these headaches are they are reported to be mild. Palpitations have occurred approximately four times in the last two weeks, are brief, and are not associated with chest pain or dyspnea. She manages these episodes with deep breathing and relaxation. Home blood pressure monitoring over the past week shows readings consistently in the 140s to 150s systolic and 80s to 90s diastolic. There is a family history of hypertension, including her father and other relatives. She experiences poor sleep quality, taking a long time to fall asleep, waking frequently, and snoring.  She also reports menopausal symptoms, including nocturnal sweating.       Medications: Outpatient Medications Prior to Visit  Medication Sig   Ascorbic Acid (VITAMIN C PO) Take by mouth.   Calcium Carbonate-Vitamin D  600-400 MG-UNIT tablet Take 1 tablet by mouth daily.   cetirizine (ZYRTEC) 10 MG tablet Take 10 mg by mouth daily.   gabapentin  (NEURONTIN ) 600 MG tablet Take 1 tablet (600 mg total) by mouth 3 (three) times daily.   levETIRAcetam  (KEPPRA ) 500 MG tablet Take 3 tablets  (1,500 mg total) by mouth 2 (two) times daily.   Vitamin D , Ergocalciferol , (DRISDOL ) 1.25 MG (50000 UNIT) CAPS capsule Take 50,000 Units by mouth every 7 (seven) days.   No facility-administered medications prior to visit.    Review of Systems  Constitutional:  Positive for fatigue. Negative for fever.  Respiratory:  Negative for cough and shortness of breath.   Cardiovascular:  Positive for palpitations. Negative for chest pain and leg swelling.  Gastrointestinal:  Negative for abdominal pain.  Neurological:  Positive for headaches. Negative for dizziness.       Objective    BP (!) 162/96 (BP Location: Left Arm)   Pulse 80   Ht 5' 2.5 (1.588 m)   Wt 178 lb 6.4 oz (80.9 kg)   BMI 32.11 kg/m    Physical Exam Constitutional:      General: She is awake.     Appearance: She is well-developed.  HENT:     Head: Normocephalic.  Eyes:     Conjunctiva/sclera: Conjunctivae normal.     Pupils: Pupils are equal, round, and reactive to light.  Cardiovascular:     Rate and Rhythm: Normal rate and regular rhythm.     Heart sounds: Normal heart sounds.  Pulmonary:     Effort: Pulmonary effort is normal.     Breath sounds: Normal breath sounds.  Skin:    General: Skin is warm.  Neurological:     Mental Status: She is alert and oriented to person, place, and time.  Psychiatric:  Attention and Perception: Attention normal.        Mood and Affect: Mood normal.        Speech: Speech normal.        Behavior: Behavior is cooperative.     No results found for any visits on 01/14/24.  Assessment & Plan    Primary hypertension Assessment & Plan: New dx but it does appear over the past few months pt has been gradually trending up.  Unclear if HA causing elevated BP or vice versa, but HA today is mild  Recommend tx with valsartan  80 mg, check bp at home. Cautioned hypotensive/orthostatic symptoms if we are over correcting  Will check cbc, cmp, tsh.  F/b 1 mo  Orders: -      Valsartan ; Take 1 tablet (80 mg total) by mouth daily.  Dispense: 90 tablet; Refill: 3  Palpitations Assessment & Plan: Pt reports have been intermittent for a while ,but increasing in frequency . Pulses regular today  Given pt is uninsured, start with labs, cbc, cmp, tsh,  If normal, recommend holter monitor.   Also consider sleep study r/o sleep apnea. Pt has poor sleep, snores, elevated BMI  Orders: -     CBC with Differential/Platelet -     Comprehensive metabolic panel with GFR -     TSH    Return in about 4 weeks (around 02/11/2024) for hypertension.       Manuelita Flatness, PA-C  [facility] [phone] (phone) [phone] (fax)  [facility]

## 2024-01-14 NOTE — Assessment & Plan Note (Signed)
 New dx but it does appear over the past few months pt has been gradually trending up.  Unclear if HA causing elevated BP or vice versa, but HA today is mild  Recommend tx with valsartan  80 mg, check bp at home. Cautioned hypotensive/orthostatic symptoms if we are over correcting  Will check cbc, cmp, tsh.  F/b 1 mo

## 2024-01-14 NOTE — Telephone Encounter (Signed)
 Appt scheduled

## 2024-01-14 NOTE — Assessment & Plan Note (Addendum)
 Pt reports have been intermittent for a while ,but increasing in frequency . Pulses regular today  Given pt is uninsured, start with labs, cbc, cmp, tsh,  If normal, recommend holter monitor.   Also consider sleep study r/o sleep apnea. Pt has poor sleep, snores, elevated BMI

## 2024-01-17 ENCOUNTER — Ambulatory Visit: Payer: Self-pay | Admitting: Physician Assistant

## 2024-01-17 DIAGNOSIS — R748 Abnormal levels of other serum enzymes: Secondary | ICD-10-CM

## 2024-01-17 DIAGNOSIS — R739 Hyperglycemia, unspecified: Secondary | ICD-10-CM

## 2024-02-04 ENCOUNTER — Other Ambulatory Visit (INDEPENDENT_AMBULATORY_CARE_PROVIDER_SITE_OTHER): Payer: Self-pay

## 2024-02-04 ENCOUNTER — Encounter: Payer: Self-pay | Admitting: Internal Medicine

## 2024-02-04 DIAGNOSIS — R739 Hyperglycemia, unspecified: Secondary | ICD-10-CM

## 2024-02-04 DIAGNOSIS — R748 Abnormal levels of other serum enzymes: Secondary | ICD-10-CM

## 2024-02-04 LAB — COMPREHENSIVE METABOLIC PANEL WITH GFR
ALT: 65 U/L — ABNORMAL HIGH (ref 0–35)
AST: 44 U/L — ABNORMAL HIGH (ref 0–37)
Albumin: 4.5 g/dL (ref 3.5–5.2)
Alkaline Phosphatase: 68 U/L (ref 39–117)
BUN: 8 mg/dL (ref 6–23)
CO2: 29 meq/L (ref 19–32)
Calcium: 9.5 mg/dL (ref 8.4–10.5)
Chloride: 102 meq/L (ref 96–112)
Creatinine, Ser: 0.6 mg/dL (ref 0.40–1.20)
GFR: 106.18 mL/min (ref >60.00)
Glucose, Bld: 93 mg/dL (ref 70–99)
Potassium: 3.6 meq/L (ref 3.5–5.1)
Sodium: 139 meq/L (ref 135–145)
Total Bilirubin: 0.7 mg/dL (ref 0.2–1.2)
Total Protein: 6.8 g/dL (ref 6.0–8.3)

## 2024-02-04 LAB — HEMOGLOBIN A1C: Hgb A1c MFr Bld: 5.8 % (ref 4.6–6.5)

## 2024-02-23 ENCOUNTER — Encounter: Payer: Self-pay | Admitting: Internal Medicine

## 2024-02-23 ENCOUNTER — Ambulatory Visit (INDEPENDENT_AMBULATORY_CARE_PROVIDER_SITE_OTHER): Payer: Self-pay | Admitting: Internal Medicine

## 2024-02-23 VITALS — BP 164/96 | HR 70 | Temp 97.9°F | Resp 16 | Ht 62.5 in | Wt 176.2 lb

## 2024-02-23 DIAGNOSIS — I10 Essential (primary) hypertension: Secondary | ICD-10-CM

## 2024-02-23 DIAGNOSIS — Z0000 Encounter for general adult medical examination without abnormal findings: Secondary | ICD-10-CM

## 2024-02-23 DIAGNOSIS — Z23 Encounter for immunization: Secondary | ICD-10-CM

## 2024-02-23 DIAGNOSIS — R739 Hyperglycemia, unspecified: Secondary | ICD-10-CM

## 2024-02-23 DIAGNOSIS — E785 Hyperlipidemia, unspecified: Secondary | ICD-10-CM

## 2024-02-23 MED ORDER — AMLODIPINE BESYLATE 5 MG PO TABS: 5.0000 mg | ORAL_TABLET | Freq: Every day | ORAL | 6 refills | Status: AC

## 2024-02-23 NOTE — Assessment & Plan Note (Signed)
 Here for CPX Other issues addressed today Hypertension New Dx 01/14/2024, came to this office, BP was 162/96, prescribed valsartan  follow-up BMP was okay BP at home remain elevated between 135 and 160s to systolic. + Mild headache likely related to elevated blood pressure. No chest pain, shortness of breath, nausea, vomiting, or diarrhea. + FH HTN. - Continue valsartan  - Add amlodipine 5 mg - Advise to report if headaches persist after blood pressure is controlled - Seek medical attention for severe headaches - EKG: Normal sinus rhythm.  No acute changes, no old EKG. Hyperlipidemia Hyperlipidemia managed with dietary modifications. - Continue dietary management - Check lipid profile Vitamin D  deficiency Last levels satisfactory. Continue over-the-counter vitamin D  supplementation Seizure disorder Seizure disorder well-controlled on levetiracetam  and gabapentin . No recent seizure activity reported.  No change.  All instructions printed and discussed with the patient in Spanish. RTC nurse visit 1 month  RTC checkup 3 months

## 2024-02-23 NOTE — Progress Notes (Signed)
 Subjective:    Patient ID: Beth Blackwell, female    DOB: December 28, 1975, 48 y.o.   MRN: [id number]  DOS:  02/23/2024 Type of visit - description: CPX  Discussed the use of AI scribe software for clinical note transcription with the patient, who gave verbal consent to proceed.  History of Present Illness  Hypertension and associated symptoms - Diagnosed with hypertension one month ago - Home blood pressure readings range from 135/86 to 162  - Mild headaches occur with elevated blood pressure - No chest pain, shortness of breath, nausea, vomiting, or diarrhea -  some Fatigue and insomnia  - Currently taking valsartan  for blood pressure management  Seizure disorder - History of seizures - No recent seizures  Peripheral edema - Occasional leg swelling after prolonged walking or travel  Medication use and adverse effects - Takes vitamin D  supplements - Avoids anti-inflammatory medications due to fatty liver    Review of Systems  Other than above, a 14 point review of systems is negative      Past Medical History:  Diagnosis Date   Depression    High cholesterol    Retained orthopedic hardware 02/2014   right wrist/forearm   Seasonal allergies    Seizures (HCC)    no seizures  since ~ 2012   Sickle cell trait    Varicose vein of leg    Vitamin D  deficiency     Past Surgical History:  Procedure Laterality Date   ABDOMINAL HYSTERECTOMY  12/28/2005   NO oophorectomy   APPENDECTOMY     CESAREAN SECTION     x 2   HARDWARE REMOVAL Right 03/09/2014   Procedure: REMOVAL PLATE AND SCREW RIGHT ULNAR;  Surgeon: Arley Curia, MD;  Location: [facility];  Service: Orthopedics;  Laterality: Right;   LAPAROSCOPIC LYSIS OF ADHESIONS  09/22/2006   abdominopelvic   OVARIAN CYST REMOVAL Right 09/22/2006   TUBAL LIGATION  02/06/2000   WRIST ARTHROSCOPY WITH ULNA SHORTENING Right 06/25/2010   and TFCC debridement, shrinkage scapholunate ligament    Current  Outpatient Medications  Medication Instructions   amLODipine (NORVASC) 5 mg, Oral, Daily   Ascorbic Acid (VITAMIN C PO) Take by mouth.   Calcium Carbonate-Vitamin D  600-400 MG-UNIT tablet 1 tablet, Daily   cetirizine (ZYRTEC) 10 mg, Daily   gabapentin  (NEURONTIN ) 600 mg, Oral, 3 times daily   levETIRAcetam  (KEPPRA ) 1,500 mg, Oral, 2 times daily   valsartan  (DIOVAN ) 80 mg, Oral, Daily       Objective:   Physical Exam BP (!) 164/96   Pulse 70   Temp 97.9 F (36.6 C) (Oral)   Resp 16   Ht 5' 2.5 (1.588 m)   Wt 176 lb 4 oz (79.9 kg)   SpO2 94%   BMI 31.72 kg/m  General: Well developed, NAD, BMI noted Neck: No  thyromegaly  HEENT:  Normocephalic . Face symmetric, atraumatic Lungs:  CTA B Normal respiratory effort, no intercostal retractions, no accessory muscle use. Heart: RRR,  no murmur.  Abdomen:  Not distended, soft, non-tender. No rebound or rigidity.   Lower extremities: no pretibial edema bilaterally  Skin: Exposed areas without rash. Not pale. Not jaundice Neurologic:  alert & oriented X3.  Speech normal, gait appropriate for age and unassisted Strength symmetric and appropriate for age.  Psych: Cognition and judgment appear intact.  Cooperative with normal attention span and concentration.  Behavior appropriate. No anxious or depressed appearing.     Assessment   Assessment HTN  Dx 12/2023 Hyperlipidemia H/o Asthma Seasonal allergies Vitamin D  deficiency Sickle cell trait History of seizures LFTs elevated:  --Hep B and C:  (-) 2017 --01-2017: wnl  AMA, alpha 1 antitrypsin, SPEP, iron studies  --Abdominal US   01-2017: fatty liver, one gallbladder polyp( no f/u  per guidelines) H/o Depression  Assessment & Plan Here for CPX -Td today - PNM 23: 11-2015 -Vaccine advised: Flu shot every fall, COVID-vaccine.  (Declined today) - Female care: -   s/p hysterectomy, no oophorectomy.   -MMG 04/06/2023. Had a aspiration of left breast cyst, per pt results ok;  rec to get a MMG 03-2024 -CCS: Hemoccult neg  03/2023, 3 options discussed, we agreed on a Hemoccult November 2025.  Kit provided --Labs:  FLP A1c -Diet and exercise discussed.  Other issues addressed today Hypertension New Dx 01/14/2024, came to this office, BP was 162/96, prescribed valsartan  follow-up BMP was okay BP at home remain elevated between 135 and 160s to systolic. + Mild headache likely related to elevated blood pressure. No chest pain, shortness of breath, nausea, vomiting, or diarrhea. + FH HTN. - Continue valsartan  - Add amlodipine 5 mg - Advise to report if headaches persist after blood pressure is controlled - Seek medical attention for severe headaches - EKG: Normal sinus rhythm.  No acute changes, no old EKG. Hyperlipidemia Hyperlipidemia managed with dietary modifications. - Continue dietary management - Check lipid profile Vitamin D  deficiency Last levels satisfactory. Continue over-the-counter vitamin D  supplementation Seizure disorder Seizure disorder well-controlled on levetiracetam  and gabapentin . No recent seizure activity reported.  No change.  All instructions printed and discussed with the patient in Spanish. RTC nurse visit 1 month  RTC checkup 3 months

## 2024-02-23 NOTE — Patient Instructions (Addendum)
 GO TO THE LAB :  Get the blood work    Then, go to the front desk for the checkout Please make an appointment for a checkup in 3 months Arrange for a nurse visit in 1 month  Add a medication called amlodipine 5 mg 1 tablet every day Continue checking your blood pressure daily Blood pressure goal:  between 110/65 and  135/85. If it is consistently higher or lower, let me know  If the headache is not improving after your blood pressure gets lower let me know If you have severe headache: Go to emergency room   Please read more detailed instructions below      HYPERTENSION: You have high blood pressure, which has been causing mild headaches. -Continue taking valsartan . -Start taking amlodipine 5 mg. -Report if headaches persist after blood pressure is controlled. -Seek medical attention for severe headaches. - Eat healthier, less salt, avoid ibuprofen naproxen or other anti-inflammatories  FATTY LIVER DISEASE: You have fatty liver disease with elevated liver function tests. -Continue with dietary modifications and lifestyle changes. -Avoid NSAIDs.  HYPERLIPIDEMIA: You have high cholesterol managed with dietary changes. -Continue with dietary management. -We will check your lipid profile.  VITAMIN D  DEFICIENCY: You have a history of low vitamin D  levels. -Continue taking over-the-counter vitamin D  supplements.  SEIZURE DISORDER: Your seizures are well-controlled with your current medications. -Continue taking levetiracetam  and gabapentin .  ADULT WELLNESS VISIT: We discussed your general health maintenance, including vaccinations and screenings. -You received a tetanus vaccine today. -We recommend getting influenza and COVID-19 vaccinations. -Schedule a mammogram in November. -Complete the stool sample kit for colon cancer screening by November.

## 2024-02-23 NOTE — Assessment & Plan Note (Signed)
 Here for CPX -Td today - PNM 23: 11-2015 -Vaccine advised: Flu shot every fall, COVID-vaccine.  (Declined today) - Female care: -   s/p hysterectomy, no oophorectomy.   -MMG 04/06/2023. Had a aspiration of left breast cyst, per pt results ok; rec to get a MMG 03-2024 -CCS: Hemoccult neg  03/2023, 3 options discussed, we agreed on a Hemoccult November 2025.  Kit provided --Labs:  FLP A1c -Diet and exercise discussed.

## 2024-02-24 ENCOUNTER — Ambulatory Visit: Payer: Self-pay | Admitting: Internal Medicine

## 2024-02-24 LAB — LIPID PANEL
Cholesterol: 224 mg/dL — ABNORMAL HIGH (ref 0–200)
HDL: 35 mg/dL — ABNORMAL LOW (ref >39.00)
LDL Cholesterol: 122 mg/dL — ABNORMAL HIGH (ref 0–99)
NonHDL: 189.44
Total CHOL/HDL Ratio: 6
Triglycerides: 336 mg/dL — ABNORMAL HIGH (ref 0.0–149.0)
VLDL: 67.2 mg/dL — ABNORMAL HIGH (ref 0.0–40.0)

## 2024-02-24 LAB — HEMOGLOBIN A1C: Hgb A1c MFr Bld: 5.5 % (ref 4.6–6.5)

## 2024-03-08 ENCOUNTER — Other Ambulatory Visit: Payer: Self-pay | Admitting: Obstetrics and Gynecology

## 2024-03-08 DIAGNOSIS — Z1231 Encounter for screening mammogram for malignant neoplasm of breast: Secondary | ICD-10-CM

## 2024-03-28 ENCOUNTER — Ambulatory Visit: Payer: Self-pay

## 2024-03-28 DIAGNOSIS — I10 Essential (primary) hypertension: Secondary | ICD-10-CM

## 2024-03-28 NOTE — Progress Notes (Signed)
 Pt here for Blood pressure check per Dr. Amon on 02/23/24: RTC nurse visit 1 month   Pt currently takes: Valsartan  80 MG & Amlodipine 5 MG   Pt reports compliance with medication.  BP today =128/84 HR =82

## 2024-05-09 ENCOUNTER — Ambulatory Visit: Payer: Self-pay

## 2024-06-09 ENCOUNTER — Encounter: Payer: Self-pay | Admitting: Internal Medicine

## 2024-06-09 ENCOUNTER — Ambulatory Visit (INDEPENDENT_AMBULATORY_CARE_PROVIDER_SITE_OTHER): Payer: Self-pay | Admitting: Internal Medicine

## 2024-06-09 VITALS — BP 128/82 | HR 74 | Temp 97.9°F | Resp 16 | Ht 62.5 in | Wt 181.2 lb

## 2024-06-09 DIAGNOSIS — E559 Vitamin D deficiency, unspecified: Secondary | ICD-10-CM

## 2024-06-09 DIAGNOSIS — G40909 Epilepsy, unspecified, not intractable, without status epilepticus: Secondary | ICD-10-CM

## 2024-06-09 DIAGNOSIS — E785 Hyperlipidemia, unspecified: Secondary | ICD-10-CM

## 2024-06-09 DIAGNOSIS — Z23 Encounter for immunization: Secondary | ICD-10-CM

## 2024-06-09 LAB — BASIC METABOLIC PANEL WITH GFR
BUN: 10 mg/dL (ref 6–23)
CO2: 28 meq/L (ref 19–32)
Calcium: 9.1 mg/dL (ref 8.4–10.5)
Chloride: 105 meq/L (ref 96–112)
Creatinine, Ser: 0.48 mg/dL (ref 0.40–1.20)
GFR: 111.77 mL/min
Glucose, Bld: 100 mg/dL — ABNORMAL HIGH (ref 70–99)
Potassium: 3.8 meq/L (ref 3.5–5.1)
Sodium: 140 meq/L (ref 135–145)

## 2024-06-09 LAB — VITAMIN D 25 HYDROXY (VIT D DEFICIENCY, FRACTURES): VITD: 44.46 ng/mL (ref 30.00–100.00)

## 2024-06-09 NOTE — Assessment & Plan Note (Signed)
 HTN:  Ambulatory BPs 120, 130.  Continue amlodipine  and valsartan .  Check BMP. Advised low-salt diet. Seizure disorder: No recent seizures.  Meds refilled by her PCP, as recommended by neurology.- Continue Levetiracetam  and Gabapentin .  Check serum levels of levetiracetam  Vitamin D  deficiency On OTC  supplements, check levels Weight gain: Dietary advice provided, mostly decrease carbohydrate intake. Insomnia: Occasional difficulty falling asleep, healthy habits discussed. Palpitations: Brief episode of palpitations, last few seconds, every 1 or 2 weeks, no associated with exertion, associated with anxiety?  We agreed on observation, if symptoms increase or they are different she will let me know. Preventive care: Declined vaccines, check hepatitis B serology. All instructions printed in English and discussed in Spanish, she verbalized understanding. RTC 6 months

## 2024-06-09 NOTE — Patient Instructions (Addendum)
 Please read your instructions carefully.   GO TO THE LAB :  Get the blood work    Go to the front desk for the checkout Please make an appointment for a checkup in 6 months   Continue checking your blood pressure regularly Blood pressure goal:  between 110/65 and  130/80. If it is consistently higher or lower, let me know     PALPITATIONS: You have been experiencing brief episodes of heart racing with nervousness   about once weekly to every two weeks. -Monitor the frequency and duration of these episodes. -If episodes become more frequent or severe, please contact the office.  INSOMNIA: You have difficulty falling asleep despite feeling very sleepy at bedtime. -Maintain a regular sleep schedule. -Avoid caffeine and electronic devices before bedtime. -If sleep difficulties persist, consider discussing further options at your next visit.  HYPERTENSION:  -Continue taking your current antihypertensive medication, amlodipine . -Follow a low-salt diet. Basic  SEIZURE DISORDER: You have not had any recent seizures and are continuing your current medications. -Continue taking Levetiracetam  and Gabapentin  as prescribed.  VITAMIN D  DEFICIENCY: Your vitamin D  levels have been stable with supplementation. -We checked your vitamin D  levels today. -Continue taking your vitamin D  supplements.

## 2024-06-09 NOTE — Progress Notes (Signed)
 "  Subjective:    Patient ID: Beth Blackwell, female    DOB: [birth year], 49 y.o.   MRN: [id number]  DOS:  06/09/2024 Follow-up  Discussed the use of AI scribe software for clinical note transcription with the patient, who gave verbal consent to proceed.  History of Present Illness Beth Blackwell is a 49 year old female with hypertension who presents with palpitations and difficulty sleeping.  Palpitations - Brief episodes occurring about once weekly to every two weeks - Each episode lasts a few seconds - Described as heart racing with associated nervousness or anxiety - No associated chest pain, shortness of breath, nausea, or dizziness - Prior electrocardiogram was normal  Hypertension - Home blood pressures range from 120/80 to 130/82 mmHg  Insomnia - Difficulty falling asleep despite feeling very sleepy at bedtime  Seizure disorder - No recent seizures - Takes Keppra  as prescribed  Weight gain - Recent weight gain  - Active at work and walks regularly without symptoms during exertion  Vitamin d  deficiency - Takes vitamin D  supplements for prior low levels   Wt Readings from Last 3 Encounters:  06/09/24 181 lb 4 oz (82.2 kg)  02/23/24 176 lb 4 oz (79.9 kg)  01/14/24 178 lb 6.4 oz (80.9 kg)     Review of Systems See above   Past Medical History:  Diagnosis Date   Depression    High cholesterol    Retained orthopedic hardware 02/2014   right wrist/forearm   Seasonal allergies    Seizures (HCC)    no seizures  since ~ 2012   Sickle cell trait    Varicose vein of leg    Vitamin D  deficiency     Past Surgical History:  Procedure Laterality Date   ABDOMINAL HYSTERECTOMY  12/28/2005   NO oophorectomy   APPENDECTOMY     CESAREAN SECTION     x 2   HARDWARE REMOVAL Right 03/09/2014   Procedure: REMOVAL PLATE AND SCREW RIGHT ULNAR;  Surgeon: Arley Curia, MD;  Location: [facility];  Service: Orthopedics;  Laterality: Right;    LAPAROSCOPIC LYSIS OF ADHESIONS  09/22/2006   abdominopelvic   OVARIAN CYST REMOVAL Right 09/22/2006   TUBAL LIGATION  02/06/2000   WRIST ARTHROSCOPY WITH ULNA SHORTENING Right 06/25/2010   and TFCC debridement, shrinkage scapholunate ligament    Current Outpatient Medications  Medication Instructions   amLODipine  (NORVASC ) 5 mg, Oral, Daily   Ascorbic Acid (VITAMIN C PO) Take by mouth.   Calcium Carbonate-Vitamin D  600-400 MG-UNIT tablet 1 tablet, Daily   cetirizine (ZYRTEC) 10 mg, Daily   gabapentin  (NEURONTIN ) 600 mg, Oral, 3 times daily   levETIRAcetam  (KEPPRA ) 1,500 mg, Oral, 2 times daily   valsartan  (DIOVAN ) 80 mg, Oral, Daily       Objective:   Physical Exam BP 128/82   Pulse 74   Temp 97.9 F (36.6 C) (Oral)   Resp 16   Ht 5' 2.5 (1.588 m)   Wt 181 lb 4 oz (82.2 kg)   SpO2 95%   BMI 32.62 kg/m  General:   Well developed, NAD, BMI noted. HEENT:  Normocephalic . Face symmetric, atraumatic Lungs:  CTA B Normal respiratory effort, no intercostal retractions, no accessory muscle use. Heart: RRR,  no murmur.  Lower extremities: no pretibial edema bilaterally  Skin: Not pale. Not jaundice Neurologic:  alert & oriented X3.  Speech normal, gait appropriate for age and unassisted Psych--  Cognition and judgment appear intact.  Cooperative with normal attention span and concentration.  Behavior appropriate. No anxious or depressed appearing.      Assessment    Assessment HTN Dx 12/2023 Hyperlipidemia H/o Asthma Seasonal allergies Vitamin D  deficiency Sickle cell trait History of seizures-- on meds, neuro rec PCP to [facility] LFTs elevated:  --Hep B and C:  (-) 2017 --01-2017: wnl  AMA, alpha 1 antitrypsin, SPEP, iron studies  --Abdominal US   01-2017: fatty liver, one gallbladder polyp( no f/u  per guidelines) H/o Depression  Assessment & Plan HTN:  Ambulatory BPs 120, 130.  Continue amlodipine  and valsartan .  Check BMP. Advised low-salt diet. Seizure disorder: No  recent seizures.  Meds refilled by her PCP, as recommended by neurology.- Continue Levetiracetam  and Gabapentin .  Check serum levels of levetiracetam  Vitamin D  deficiency On OTC  supplements, check levels Weight gain: Dietary advice provided, mostly decrease carbohydrate intake. Insomnia: Occasional difficulty falling asleep, healthy habits discussed. Palpitations: Brief episode of palpitations, last few seconds, every 1 or 2 weeks, no associated with exertion, associated with anxiety?  We agreed on observation, if symptoms increase or they are different she will let me know. Preventive care: Declined vaccines, check hepatitis B serology. All instructions printed in English and discussed in Spanish, she verbalized understanding. RTC 6 months    "

## 2024-06-11 LAB — HEPATITIS B SURFACE ANTIGEN: Hepatitis B Surface Ag: NONREACTIVE

## 2024-06-11 LAB — HEPATITIS B SURFACE ANTIBODY,QUALITATIVE: Hep B S Ab: NONREACTIVE

## 2024-06-14 LAB — LEVETIRACETAM LEVEL: Keppra (Levetiracetam): 8.7 ug/mL — ABNORMAL LOW

## 2024-06-16 ENCOUNTER — Ambulatory Visit: Payer: Self-pay | Admitting: Internal Medicine

## 2024-06-26 ENCOUNTER — Ambulatory Visit: Payer: Self-pay

## 2024-07-06 ENCOUNTER — Ambulatory Visit: Payer: Self-pay

## 2024-08-10 ENCOUNTER — Ambulatory Visit: Payer: Self-pay | Admitting: Obstetrics and Gynecology

## 2024-12-08 ENCOUNTER — Ambulatory Visit: Payer: Self-pay | Admitting: Internal Medicine
